# Patient Record
Sex: Male | Born: 1935 | Race: White | Hispanic: No | Marital: Married | Smoking: Never smoker
Health system: Southern US, Community
[De-identification: ages and names within clinical notes are randomized; demographics above are authoritative.]

## PROBLEM LIST (undated history)

## (undated) DIAGNOSIS — K922 Gastrointestinal hemorrhage, unspecified: Secondary | ICD-10-CM

## (undated) DIAGNOSIS — N189 Chronic kidney disease, unspecified: Secondary | ICD-10-CM

## (undated) DIAGNOSIS — E538 Deficiency of other specified B group vitamins: Secondary | ICD-10-CM

## (undated) DIAGNOSIS — Z9289 Personal history of other medical treatment: Secondary | ICD-10-CM

## (undated) DIAGNOSIS — K5733 Diverticulitis of large intestine without perforation or abscess with bleeding: Secondary | ICD-10-CM

## (undated) DIAGNOSIS — I639 Cerebral infarction, unspecified: Secondary | ICD-10-CM

## (undated) DIAGNOSIS — I4891 Unspecified atrial fibrillation: Secondary | ICD-10-CM

## (undated) HISTORY — DX: Diverticulitis of large intestine without perforation or abscess with bleeding: K57.33

## (undated) HISTORY — PX: CATARACT EXTRACTION, BILATERAL: SHX1313

## (undated) HISTORY — DX: Gastrointestinal hemorrhage, unspecified: K92.2

## (undated) HISTORY — DX: Cerebral infarction, unspecified: I63.9

## (undated) HISTORY — PX: TRACHEOSTOMY: SUR1362

## (undated) HISTORY — DX: Personal history of other medical treatment: Z92.89

---

## 1981-08-24 DIAGNOSIS — I639 Cerebral infarction, unspecified: Secondary | ICD-10-CM

## 1981-08-24 DIAGNOSIS — G459 Transient cerebral ischemic attack, unspecified: Secondary | ICD-10-CM

## 1981-08-24 HISTORY — DX: Cerebral infarction, unspecified: I63.9

## 1981-08-24 HISTORY — DX: Transient cerebral ischemic attack, unspecified: G45.9

## 1988-08-24 HISTORY — PX: SEPTOPLASTY: SUR1290

## 2000-09-24 ENCOUNTER — Encounter: Payer: Self-pay | Admitting: Family Medicine

## 2000-09-24 LAB — CONVERTED CEMR LAB: PSA: 0.6 ng/mL

## 2004-07-04 ENCOUNTER — Ambulatory Visit: Payer: Self-pay | Admitting: Family Medicine

## 2005-06-09 ENCOUNTER — Ambulatory Visit: Payer: Self-pay | Admitting: Family Medicine

## 2005-12-08 ENCOUNTER — Ambulatory Visit: Payer: Self-pay | Admitting: Family Medicine

## 2006-01-04 ENCOUNTER — Ambulatory Visit: Payer: Self-pay | Admitting: Family Medicine

## 2006-01-04 LAB — CONVERTED CEMR LAB: PSA: 0.52 ng/mL

## 2006-01-07 ENCOUNTER — Ambulatory Visit: Payer: Self-pay | Admitting: Family Medicine

## 2006-02-18 ENCOUNTER — Ambulatory Visit: Payer: Self-pay | Admitting: Family Medicine

## 2006-04-05 ENCOUNTER — Ambulatory Visit: Payer: Self-pay | Admitting: Family Medicine

## 2006-04-07 ENCOUNTER — Ambulatory Visit: Payer: Self-pay | Admitting: Family Medicine

## 2006-05-10 ENCOUNTER — Ambulatory Visit: Payer: Self-pay | Admitting: Family Medicine

## 2006-06-11 ENCOUNTER — Ambulatory Visit: Payer: Self-pay | Admitting: Family Medicine

## 2006-09-28 ENCOUNTER — Ambulatory Visit: Payer: Self-pay | Admitting: Family Medicine

## 2006-12-06 ENCOUNTER — Ambulatory Visit: Payer: Self-pay | Admitting: Family Medicine

## 2007-01-13 ENCOUNTER — Encounter: Payer: Self-pay | Admitting: Family Medicine

## 2007-01-14 ENCOUNTER — Ambulatory Visit: Payer: Self-pay | Admitting: Family Medicine

## 2007-01-14 LAB — CONVERTED CEMR LAB
Albumin: 3.6 g/dL (ref 3.5–5.2)
Alkaline Phosphatase: 55 units/L (ref 39–117)
Basophils Absolute: 0 10*3/uL (ref 0.0–0.1)
Basophils Relative: 0.2 % (ref 0.0–1.0)
Bilirubin, Direct: 0.2 mg/dL (ref 0.0–0.3)
Cholesterol: 144 mg/dL (ref 0–200)
Eosinophils Absolute: 0.3 10*3/uL (ref 0.0–0.6)
GFR calc non Af Amer: 52 mL/min
Glucose, Bld: 106 mg/dL — ABNORMAL HIGH (ref 70–99)
HCT: 40.3 % (ref 39.0–52.0)
HDL: 41.7 mg/dL (ref >39.0)
Lymphocytes Relative: 16.6 % (ref 12.0–46.0)
MCV: 88.9 fL (ref 78.0–100.0)
Monocytes Absolute: 0.2 10*3/uL (ref 0.2–0.7)
Neutro Abs: 5.9 10*3/uL (ref 1.4–7.7)
Neutrophils Relative %: 76.1 % (ref 43.0–77.0)
PSA: 0.47 ng/mL (ref 0.10–4.00)
RDW: 12.7 % (ref 11.5–14.6)
Sodium: 141 meq/L (ref 135–145)
Total Bilirubin: 1.5 mg/dL — ABNORMAL HIGH (ref 0.3–1.2)
Total Protein: 6.6 g/dL (ref 6.0–8.3)
Triglycerides: 70 mg/dL (ref 0–149)
Vitamin B-12: 285 pg/mL (ref 211–911)

## 2007-01-17 DIAGNOSIS — K219 Gastro-esophageal reflux disease without esophagitis: Secondary | ICD-10-CM

## 2007-01-17 DIAGNOSIS — E538 Deficiency of other specified B group vitamins: Secondary | ICD-10-CM | POA: Insufficient documentation

## 2007-01-19 ENCOUNTER — Ambulatory Visit: Payer: Self-pay | Admitting: Family Medicine

## 2007-02-10 ENCOUNTER — Ambulatory Visit: Payer: Self-pay | Admitting: Family Medicine

## 2007-02-11 ENCOUNTER — Encounter (INDEPENDENT_AMBULATORY_CARE_PROVIDER_SITE_OTHER): Payer: Self-pay | Admitting: *Deleted

## 2007-02-21 ENCOUNTER — Ambulatory Visit: Payer: Self-pay | Admitting: Family Medicine

## 2007-03-21 ENCOUNTER — Ambulatory Visit: Payer: Self-pay | Admitting: Family Medicine

## 2007-07-01 ENCOUNTER — Ambulatory Visit: Payer: Self-pay | Admitting: Family Medicine

## 2007-08-02 ENCOUNTER — Ambulatory Visit: Payer: Self-pay | Admitting: Family Medicine

## 2007-08-04 ENCOUNTER — Telehealth (INDEPENDENT_AMBULATORY_CARE_PROVIDER_SITE_OTHER): Payer: Self-pay | Admitting: *Deleted

## 2007-10-06 ENCOUNTER — Ambulatory Visit: Payer: Self-pay | Admitting: Family Medicine

## 2007-11-24 ENCOUNTER — Ambulatory Visit: Payer: Self-pay | Admitting: Family Medicine

## 2008-01-24 ENCOUNTER — Ambulatory Visit: Payer: Self-pay | Admitting: Family Medicine

## 2008-08-07 ENCOUNTER — Ambulatory Visit: Payer: Self-pay | Admitting: Family Medicine

## 2008-08-09 ENCOUNTER — Encounter: Payer: Self-pay | Admitting: Family Medicine

## 2008-10-17 ENCOUNTER — Ambulatory Visit: Payer: Self-pay | Admitting: Family Medicine

## 2008-10-17 LAB — CONVERTED CEMR LAB
ALT: 12 units/L (ref 0–53)
BUN: 16 mg/dL (ref 6–23)
CO2: 29 meq/L (ref 19–32)
Calcium: 9.2 mg/dL (ref 8.4–10.5)
Chloride: 103 meq/L (ref 96–112)
Eosinophils Absolute: 0.3 10*3/uL (ref 0.0–0.7)
GFR calc Af Amer: 62 mL/min
Glucose, Bld: 96 mg/dL (ref 70–99)
LDL Cholesterol: 75 mg/dL (ref 0–99)
MCHC: 34.2 g/dL (ref 30.0–36.0)
Monocytes Absolute: 0.7 10*3/uL (ref 0.1–1.0)
Monocytes Relative: 6.3 % (ref 3.0–12.0)
PSA: 0.41 ng/mL (ref 0.10–4.00)
Potassium: 4.5 meq/L (ref 3.5–5.1)
Total CHOL/HDL Ratio: 3.1
VLDL: 16 mg/dL (ref 0–40)
Vitamin B-12: >1500 pg/mL — ABNORMAL HIGH (ref 211–911)

## 2008-10-31 ENCOUNTER — Ambulatory Visit: Payer: Self-pay | Admitting: Family Medicine

## 2009-04-22 ENCOUNTER — Ambulatory Visit: Payer: Self-pay | Admitting: Family Medicine

## 2009-05-22 ENCOUNTER — Ambulatory Visit: Payer: Self-pay | Admitting: Family Medicine

## 2009-05-28 ENCOUNTER — Ambulatory Visit: Payer: Self-pay | Admitting: Family Medicine

## 2009-06-26 ENCOUNTER — Ambulatory Visit: Payer: Self-pay | Admitting: Family Medicine

## 2009-08-12 ENCOUNTER — Ambulatory Visit: Payer: Self-pay | Admitting: Family Medicine

## 2009-10-22 DIAGNOSIS — K5733 Diverticulitis of large intestine without perforation or abscess with bleeding: Secondary | ICD-10-CM

## 2009-10-22 HISTORY — DX: Diverticulitis of large intestine without perforation or abscess with bleeding: K57.33

## 2009-11-15 ENCOUNTER — Telehealth: Payer: Self-pay | Admitting: Family Medicine

## 2009-11-15 ENCOUNTER — Inpatient Hospital Stay (HOSPITAL_COMMUNITY): Admission: EM | Admit: 2009-11-15 | Discharge: 2009-11-19 | Payer: Self-pay | Admitting: Emergency Medicine

## 2009-11-15 ENCOUNTER — Ambulatory Visit: Payer: Self-pay | Admitting: Internal Medicine

## 2009-11-16 ENCOUNTER — Encounter: Payer: Self-pay | Admitting: Internal Medicine

## 2009-11-16 ENCOUNTER — Encounter (INDEPENDENT_AMBULATORY_CARE_PROVIDER_SITE_OTHER): Payer: Self-pay | Admitting: Internal Medicine

## 2009-11-19 ENCOUNTER — Encounter: Payer: Self-pay | Admitting: Family Medicine

## 2009-11-22 ENCOUNTER — Encounter: Payer: Self-pay | Admitting: Gastroenterology

## 2009-11-22 ENCOUNTER — Inpatient Hospital Stay (HOSPITAL_COMMUNITY): Admission: EM | Admit: 2009-11-22 | Discharge: 2009-11-27 | Payer: Self-pay | Admitting: Emergency Medicine

## 2009-11-22 ENCOUNTER — Encounter: Payer: Self-pay | Admitting: Family Medicine

## 2009-11-22 ENCOUNTER — Telehealth: Payer: Self-pay | Admitting: Family Medicine

## 2009-11-22 DIAGNOSIS — K922 Gastrointestinal hemorrhage, unspecified: Secondary | ICD-10-CM

## 2009-11-22 HISTORY — DX: Gastrointestinal hemorrhage, unspecified: K92.2

## 2009-11-24 DIAGNOSIS — Z9289 Personal history of other medical treatment: Secondary | ICD-10-CM

## 2009-11-24 HISTORY — DX: Personal history of other medical treatment: Z92.89

## 2009-11-25 ENCOUNTER — Encounter: Payer: Self-pay | Admitting: Gastroenterology

## 2009-11-25 ENCOUNTER — Telehealth: Payer: Self-pay | Admitting: Family Medicine

## 2009-11-27 ENCOUNTER — Encounter: Payer: Self-pay | Admitting: Family Medicine

## 2009-11-27 ENCOUNTER — Telehealth: Payer: Self-pay | Admitting: Family Medicine

## 2009-11-27 DIAGNOSIS — K5731 Diverticulosis of large intestine without perforation or abscess with bleeding: Secondary | ICD-10-CM

## 2009-11-28 ENCOUNTER — Telehealth: Payer: Self-pay | Admitting: Family Medicine

## 2009-11-29 ENCOUNTER — Telehealth: Payer: Self-pay | Admitting: Internal Medicine

## 2009-11-29 ENCOUNTER — Telehealth: Payer: Self-pay | Admitting: Family Medicine

## 2009-11-29 ENCOUNTER — Ambulatory Visit: Payer: Self-pay | Admitting: Cardiology

## 2009-11-29 DIAGNOSIS — Z9289 Personal history of other medical treatment: Secondary | ICD-10-CM

## 2009-11-29 HISTORY — DX: Personal history of other medical treatment: Z92.89

## 2009-12-03 ENCOUNTER — Encounter: Payer: Self-pay | Admitting: Family Medicine

## 2009-12-10 ENCOUNTER — Ambulatory Visit: Payer: Self-pay | Admitting: Family Medicine

## 2009-12-11 LAB — CONVERTED CEMR LAB
Eosinophils Relative: 3.1 % (ref 0.0–5.0)
Folate: 10.2 ng/mL
HCT: 35.1 % — ABNORMAL LOW (ref 39.0–52.0)
Iron: 63 ug/dL (ref 42–165)
Lymphocytes Relative: 11.1 % — ABNORMAL LOW (ref 12.0–46.0)
MCHC: 33.5 g/dL (ref 30.0–36.0)
Monocytes Absolute: 0.6 10*3/uL (ref 0.1–1.0)
Neutro Abs: 7.3 10*3/uL (ref 1.4–7.7)
Neutrophils Relative %: 78.9 % — ABNORMAL HIGH (ref 43.0–77.0)
RDW: 15.8 % — ABNORMAL HIGH (ref 11.5–14.6)
WBC: 9.3 10*3/uL (ref 4.5–10.5)

## 2009-12-19 ENCOUNTER — Encounter: Payer: Self-pay | Admitting: Family Medicine

## 2010-01-03 ENCOUNTER — Telehealth: Payer: Self-pay | Admitting: Family Medicine

## 2010-01-13 ENCOUNTER — Encounter: Payer: Self-pay | Admitting: Family Medicine

## 2010-01-14 ENCOUNTER — Ambulatory Visit: Payer: Self-pay | Admitting: Family Medicine

## 2010-01-14 DIAGNOSIS — R630 Anorexia: Secondary | ICD-10-CM

## 2010-01-28 ENCOUNTER — Ambulatory Visit: Payer: Self-pay | Admitting: Family Medicine

## 2010-01-28 LAB — CONVERTED CEMR LAB
Basophils Relative: 0.5 % (ref 0.0–3.0)
Eosinophils Absolute: 0.3 10*3/uL (ref 0.0–0.7)
Hemoglobin: 12.9 g/dL — ABNORMAL LOW (ref 13.0–17.0)
Monocytes Absolute: 0.7 10*3/uL (ref 0.1–1.0)
Neutrophils Relative %: 75.6 % (ref 43.0–77.0)
Platelets: 192 10*3/uL (ref 150.0–400.0)

## 2010-02-26 ENCOUNTER — Ambulatory Visit: Payer: Self-pay | Admitting: Family Medicine

## 2010-03-07 ENCOUNTER — Telehealth: Payer: Self-pay | Admitting: Family Medicine

## 2010-03-10 ENCOUNTER — Encounter: Payer: Self-pay | Admitting: Family Medicine

## 2010-03-26 ENCOUNTER — Encounter (INDEPENDENT_AMBULATORY_CARE_PROVIDER_SITE_OTHER): Payer: Self-pay | Admitting: *Deleted

## 2010-04-30 ENCOUNTER — Ambulatory Visit: Payer: Self-pay | Admitting: Family Medicine

## 2010-05-13 ENCOUNTER — Ambulatory Visit: Payer: Self-pay | Admitting: Family Medicine

## 2010-05-29 ENCOUNTER — Ambulatory Visit: Payer: Self-pay | Admitting: Family Medicine

## 2010-05-29 LAB — CONVERTED CEMR LAB
ALT: 13 units/L (ref 0–53)
Albumin: 3.9 g/dL (ref 3.5–5.2)
Alkaline Phosphatase: 49 units/L (ref 39–117)
BUN: 20 mg/dL (ref 6–23)
Basophils Relative: 0.4 % (ref 0.0–3.0)
Calcium: 9.4 mg/dL (ref 8.4–10.5)
Eosinophils Relative: 1.5 % (ref 0.0–5.0)
HCT: 41 % (ref 39.0–52.0)
Lymphs Abs: 1.2 10*3/uL (ref 0.7–4.0)
Monocytes Absolute: 0.7 10*3/uL (ref 0.1–1.0)
Neutro Abs: 6.9 10*3/uL (ref 1.4–7.7)
RBC: 4.4 M/uL (ref 4.22–5.81)
Sodium: 136 meq/L (ref 135–145)
Total Bilirubin: 1.9 mg/dL — ABNORMAL HIGH (ref 0.3–1.2)
Total Protein: 6.5 g/dL (ref 6.0–8.3)
WBC: 8.9 10*3/uL (ref 4.5–10.5)

## 2010-06-17 ENCOUNTER — Ambulatory Visit: Payer: Self-pay | Admitting: Family Medicine

## 2010-09-17 ENCOUNTER — Encounter: Payer: Self-pay | Admitting: Family Medicine

## 2010-09-24 NOTE — Assessment & Plan Note (Signed)
Summary: ROA FOR FOLLOW-UP/JRR   Vital Signs:  Patient profile:   77 year old male Weight:      159 pounds Temp:     97.3 degrees F oral Pulse rate:   60 / minute Pulse rhythm:   regular BP sitting:   134 / 66  (left arm) Cuff size:   regular  Vitals Entered By: Sydell Axon LPN (May 29, 2010 10:36 AM) CC: follow-up visit   History of Present Illness: Pt here with his wife for followup.  He had been in the hospital for GI bleeding from presumed diverticuli and has done well. He is caring for his wife who has significant Alzheimer's and looks to be quite tired and haggard. He denies feeling bad and has a lady coming into the house twice a week to help, which he thinks is manageable at this point. He denies seeing blood.  Problems Prior to Update: 1)  Anorexia  (ICD-783.0) 2)  Diverticulosis, Colon, With Hemorrhage  (ICD-562.12) 3)  Gastrointestinal Hemorrhage, Recurrent Lower Gi  (ICD-578.9) 4)  Screening For Malignant Neoplasm, Prostate  (ICD-V76.44) 5)  Gerd  (ICD-530.81) 6)  B12 Deficiency  (ICD-266.2) 7)  Hypercholesterolemia, 278/ Ldl 208  (ICD-272.0) 8)  Hiatal Hernia Via Egd (DR. Doreatha Martin)  (ICD-553.3)  Medications Prior to Update: 1)  Cyanocobalamin 1000 Mcg/ml Soln (Cyanocobalamin) .... Take Injection Monthly 2)  Vytorin 10-20 Mg Tabs (Ezetimibe-Simvastatin) .... Take One By Mouth Daily 3)  Tums 500 Mg Chew (Calcium Carbonate Antacid) .... As Needed 4)  Pantoprazole Sodium 40 Mg Tbec (Pantoprazole Sodium) .... Take One By Mouth Daily 5)  Acetaminophen 325 Mg Tabs (Acetaminophen) .... Take 2 By Mouth Every 4 Hours As Needed Pain 6)  Metoprolol Tartrate 25 Mg Tabs (Metoprolol Tartrate) .... Take One By Mouth Two Times A Day 7)  Mirtazapine 15 Mg Tabs (Mirtazapine) .Marland Kitchen.. 1 Tab By Mouth Nightly  Allergies: 1)  ! Sulfadiazine (Sulfadiazine)  Physical Exam  General:  Well-developed,well-nourished,in no acute distress; alert,appropriate and cooperative throughout  examination, thin but weight stable. Head:  Normocephalic and atraumatic without obvious abnormalities. No apparent alopecia or balding. Eyes:  Conjunctiva clear bilaterally.  Ears:  External ear exam shows no significant lesions or deformities.  Otoscopic examination reveals clear canals, tympanic membranes are intact bilaterally without bulging, retraction, inflammation or discharge. Hearing is grossly normal bilaterally. Nose:  External nasal examination shows no deformity or inflammation. Nasal mucosa are pink and moist without lesions or exudates. Mouth:  Oral mucosa and oropharynx without lesions or exudates.  Teeth in good repair. Neck:  No deformities, masses, or tenderness noted. Chest Wall:  No deformities, masses, tenderness or gynecomastia noted. Lungs:  Normal respiratory effort, chest expands symmetrically. Lungs are clear to auscultation, no crackles or wheezes. Heart:  Normal rate and regular rhythm. S1 and S2 normal without gallop, murmur, click, rub or other extra sounds. Abdomen:  Bowel sounds positive, very active but benign sounding, abdomen soft and non-tender without masses, organomegaly or hernias noted. Psych:  Cognition and judgment appear intact. Alert and cooperative with normal attention span and concentration. No apparent delusions, illusions, hallucinations   Impression & Recommendations:  Problem # 1:  ANOREXIA (ICD-783.0) Assessment Improved Eating better with  stabilized weight.  Problem # 2:  DIVERTICULOSIS, COLON, WITH HEMORRHAGE (ICD-562.12) Assessment: Unchanged  Appears stable.  Labs Reviewed: Hgb: 12.9 (01/28/2010)   Hct: 38.0 (01/28/2010)   WBC: 8.9 (01/28/2010)  Problem # 3:  GASTROINTESTINAL HEMORRHAGE, RECURRENT LOWER GI (ICD-578.9) Assessment: Unchanged Appears stable.  Will recheck blood count. Orders: TLB-CBC Platelet - w/Differential (85025-CBCD) TLB-BMP (Basic Metabolic Panel-BMET) (80048-METABOL) TLB-Hepatic/Liver Function Pnl  (80076-HEPATIC)  Complete Medication List: 1)  Cyanocobalamin 1000 Mcg/ml Soln (Cyanocobalamin) .... Take injection monthly 2)  Vytorin 10-20 Mg Tabs (Ezetimibe-simvastatin) .... Take one by mouth daily 3)  Tums 500 Mg Chew (Calcium carbonate antacid) .... As needed 4)  Pantoprazole Sodium 40 Mg Tbec (Pantoprazole sodium) .... Take one by mouth daily 5)  Acetaminophen 325 Mg Tabs (Acetaminophen) .... Take 2 by mouth every 4 hours as needed pain 6)  Metoprolol Tartrate 25 Mg Tabs (Metoprolol tartrate) .... Take one by mouth two times a day  Patient Instructions: 1)  RTC as needed or 3 mos, whichever sooner.  Current Allergies (reviewed today): ! SULFADIAZINE (SULFADIAZINE)

## 2010-09-24 NOTE — Letter (Signed)
Summary: Dr.Carl [facility] Consultation  [facility] Consultation   Imported By: Beau Fanny 11/25/2009 14:51:16  _____________________________________________________________________  External Attachment:    Type:   Image     Comment:   External Document

## 2010-09-24 NOTE — Assessment & Plan Note (Signed)
Summary: B-12  Nurse Visit   Allergies: 1)  ! Sulfadiazine (Sulfadiazine)  Medication Administration  Injection # 1:    Medication: Vit B12 1000 mcg    Diagnosis: B12 DEFICIENCY (ICD-266.2)    Route: IM    Site: L deltoid    Exp Date: 07/24/2011    Lot #: [id number]    Mfr: [employer] Regent    Patient tolerated injection without complications    Given by: Delilah Shan CMA Duncan Dull) (Jan 14, 2010 9:48 AM)  Orders Added: 1)  Admin of Therapeutic Inj  intramuscular or subcutaneous [96372] 2)  Vit B12 1000 mcg [J3420]   Medication Administration  Injection # 1:    Medication: Vit B12 1000 mcg    Diagnosis: B12 DEFICIENCY (ICD-266.2)    Route: IM    Site: L deltoid    Exp Date: 07/24/2011    Lot #: [id number]    Mfr: American Regent    Patient tolerated injection without complications    Given by: Delilah Shan CMA (AAMA) (Jan 14, 2010 9:48 AM)  Orders Added: 1)  Admin of Therapeutic Inj  intramuscular or subcutaneous [96372] 2)  Vit B12 1000 mcg [J3420]

## 2010-09-24 NOTE — Progress Notes (Signed)
Summary: regarding meds  Phone Note Call from Patient   Caller: Magnus Ivan [phone] Call For: Shaune Leeks MD Summary of Call: Pt's son called again today regarding pt's new meds.  Do you think pt should start metoprolol and pantoprazole?  He knows you arent in today. He also wanted you  to know that Dr. Irene Limbo" DID NOT" want to keep the pt in the hospital.   Initial call taken by: Lowella Petties CMA,  November 29, 2009 8:58 AM  Follow-up for Phone Call        Starting Metoprolol reasonable to protect heart against another heart attack.  Pantoprazole is merely prophylactic measure. Probably good idea for one month or so. Follow-up by: Shaune Leeks MD,  December 02, 2009 7:55 AM  Additional Follow-up for Phone Call Additional follow up Details #1::        Advised pt's son. Additional Follow-up by: Lowella Petties CMA,  December 02, 2009 11:00 AM

## 2010-09-24 NOTE — Letter (Signed)
Summary: [facility],Medical Necessity for Wheelchair  [facility] Equipment,Medical Necessity for Wheelchair   Imported By: Beau Fanny 12/04/2009 10:20:49  _____________________________________________________________________  External Attachment:    Type:   Image     Comment:   External Document

## 2010-09-24 NOTE — Progress Notes (Signed)
Summary: regarding meds and CT  Phone Note Call from Patient   Caller: SonAnnette Stable  [phone] Summary of Call: Pt was discharged from hospital yesterday and was given scripts for simvastatin 20 mg- 1 a day, pantoprazole 40 mg- one a day, and metoprolol 25 mg- one two times a day.  Son wants your opinion on whether pt should take these, he is already taking vytorin.  Also, he is asking if you think the pt should have an abd CT scan in regards to his GI bleed. Initial call taken by: Lowella Petties CMA,  November 28, 2009 9:28 AM  Follow-up for Phone Call        If can afford Vytorin, would continue. IF breaking the bank, Simvastatin ok. Will request CT abd w/ to assess abd. Pls tell them Dr Irene Limbo, the last doctor to take care of Mr Bonnette called me this AM. He wanted to kepp Mr Harner in the hospitqal to get the CT today, but he was insistent on getting home yesterday. CT ordered. Follow-up by: Shaune Leeks MD,  November 28, 2009 1:56 PM  Additional Follow-up for Phone Call Additional follow up Details #1::        CT ABD / PeL set up at [facility] on 11/29/2009 at 12:15pm. Patients son notified of appt. Carlton Adam  November 28, 2009 4:30 PM  Additional Follow-up by: Carlton Adam,  November 28, 2009 4:30 PM  New Problems: GASTROINTESTINAL HEMORRHAGE, RECURRENT LOWER GI (ICD-578.9)   New Problems: GASTROINTESTINAL HEMORRHAGE, RECURRENT LOWER GI (ICD-578.9)

## 2010-09-24 NOTE — Letter (Signed)
Summary: Ronnie Chan letter  [place] [facility]  [address] [place], [place] [address]   Phone: [phone]  Fax: [phone]       03/26/2010 MRN: [id number]  Ronnie Chan [address] [place], [place]  [address]  Dear [name] [place], and [place] announce the retirement of [name], M.D., from full-time practice at the [facility] office effective February 20, 2010 and his plans of returning part-time.  It is important to [name] and to our practice that you understand that [facility] has seven physicians in our office for your health care needs.  We will continue to offer the same exceptional care that you have today.    [name] has spoken to many of you about his plans for retirement and returning part-time in the fall.   We will continue to work with you through the transition to schedule appointments for you in the office and meet the high standards that [name] is committed to.   Again, it is with great pleasure that we share the news that [name] will return to [facility] in October of 2011 with a reduced schedule.    If you have any questions, or would like to request an appointment with one of our physicians, please call us at [phone] and press the option for Scheduling an appointment.  We take pleasure in providing you with excellent patient care and look forward to seeing you at your next office visit.  Our [facility] Physicians are:  Tillman Abide, M.D. Laurita Quint, M.D. Roxy Manns, M.D. Kerby Nora, M.D. Hannah Beat, M.D. Ruthe Mannan, M.D. We proudly welcomed Raechel Ache, M.D. and Eustaquio Boyden, M.D. to the practice in July/August 2011.  Sincerely,  [facility]

## 2010-09-24 NOTE — Letter (Signed)
Summary: [facility] Equipment Documentation for Medical Necessity f  [facility] Equipment Documentation for Medical Necessity for Wheelchair   Imported By: Beau Fanny 11/29/2009 11:45:18  _____________________________________________________________________  External Attachment:    Type:   Image     Comment:   External Document

## 2010-09-24 NOTE — Progress Notes (Signed)
Summary: wants order for wheel chair  Phone Note Call from Patient   Caller: Laveda Norman [phone] Summary of Call: Son is asking for an order for a wheel chair for pt.   He is back at [facility], has been passing blood rectally, has been there since 3/31 Initial call taken by: Lowella Petties CMA,  November 25, 2009 1:54 PM  Follow-up for Phone Call        done...weakness 780.79 and acute blood loss anemia 285.1. Follow-up by: Shaune Leeks MD,  November 25, 2009 2:00 PM  Additional Follow-up for Phone Call Additional follow up Details #1::        Son to pick up order. Additional Follow-up by: Lowella Petties CMA,  November 26, 2009 12:44 PM

## 2010-09-24 NOTE — Consult Note (Signed)
Summary: Dr.Jens Eichhorn,[facility],Note  Dr.Jens Eichhorn,[facility],Note   Imported By: Beau Fanny 01/06/2010 14:46:16  _____________________________________________________________________  External Attachment:    Type:   Image     Comment:   External Document

## 2010-09-24 NOTE — Letter (Signed)
Summary: Note from Misty Stanley Mikowski,pt's daughter-in-law  Note from [place] Hosick,pt's daughter-in-law   Imported By: Beau Fanny 01/14/2010 15:04:23  _____________________________________________________________________  External Attachment:    Type:   Image     Comment:   External Document

## 2010-09-24 NOTE — Assessment & Plan Note (Signed)
Summary: SCHALLER B12/RBH  Nurse Visit   Allergies: 1)  ! Sulfadiazine (Sulfadiazine)  Medication Administration  Injection # 1:    Medication: Vit B12 1000 mcg    Diagnosis: B12 DEFICIENCY (ICD-266.2)    Route: IM    Site: R deltoid    Exp Date: 01/23/2012    Lot #: [id number]    Mfr: [employer] Regent    Patient tolerated injection without complications    Given by: Sydell Axon LPN (June 17, 2010 11:26 AM)  Orders Added: 1)  Vit B12 1000 mcg [J3420] 2)  Admin of Therapeutic Inj  intramuscular or subcutaneous [96372]   Medication Administration  Injection # 1:    Medication: Vit B12 1000 mcg    Diagnosis: B12 DEFICIENCY (ICD-266.2)    Route: IM    Site: R deltoid    Exp Date: 01/23/2012    Lot #: [id number]    Mfr: American Regent    Patient tolerated injection without complications    Given by: Sydell Axon LPN (June 17, 2010 11:26 AM)  Orders Added: 1)  Vit B12 1000 mcg [J3420] 2)  Admin of Therapeutic Inj  intramuscular or subcutaneous [04540]

## 2010-09-24 NOTE — Procedures (Signed)
Summary: Capsule Endoscopy   Capsule Endoscopy  Procedure date:  11/25/2009  Findings:      Performing Location: [facility]   Ordering Physician: Karolee Ohs, MD  Report created/read by: Claudette Head, MD  Reason for Referral:  6 t/o male admitted to [facility] with GI bleeding.  EGD and colonoscopy pertinent for diverticulosis.  R/O small bowel source for bleed.    Procedure Information and Findings:  1) Complete study, good prep 2) duodenal AVM, small 3 ) smooth large whitish protruding lesion at 1 hr and 48 minutes ? Lymphoid but larger than what is usually seen, no evidence of ulceration.    Summary and Recommendations:  No findings to explain acute bleed-suspect diverticular in origin.    This report was created from the original report, which was reviewed and signed by the above listed reading physician.

## 2010-09-24 NOTE — Progress Notes (Signed)
Summary: prior auth needed for pantoprazole  Phone Note From Pharmacy   Caller: [facility] [address] Summary of Call: Prior Berkley Harvey is needed for pantoprazole, form is on your desk. Initial call taken by: Lowella Petties CMA,  March 07, 2010 2:30 PM  Follow-up for Phone Call        signed.  Follow-up by: Crawford Givens MD,  March 09, 2010 4:55 PM  Additional Follow-up for Phone Call Additional follow up Details #1::        Faxed. Additional Follow-up by: Delilah Shan CMA Aster Eckrich Dull),  March 10, 2010 8:40 AM     Appended Document: prior auth needed for pantoprazole Prior auth received for pantoprazole, pharmacy advised, letter and form placed up front to be scanned.

## 2010-09-24 NOTE — Assessment & Plan Note (Signed)
Summary: [place]can flu shot/rbh  Nurse Visit   Allergies: 1)  ! Sulfadiazine (Sulfadiazine)  Immunizations Administered:  Influenza Vaccine # 1:    Vaccine Type: Fluvax 3+    Site: left deltoid    Mfr: GlaxoSmithKline    Dose: 0.5 ml    Route: IM    Given by: Mervin Hack CMA (AAMA)    Exp. Date: 02/21/2011    Lot #: [id number]    VIS given: 03/18/10 version given May 13, 2010.  Flu Vaccine Consent Questions:    Do you have a history of severe allergic reactions to this vaccine? no    Any prior history of allergic reactions to egg and/or gelatin? no    Do you have a sensitivity to the preservative Thimersol? no    Do you have a past history of Guillan-Barre Syndrome? no    Do you currently have an acute febrile illness? no    Have you ever had a severe reaction to latex? no    Vaccine information given and explained to patient? yes  Orders Added: 1)  Flu Vaccine 58yrs + [90658] 2)  Admin 1st Vaccine [08657]

## 2010-09-24 NOTE — Assessment & Plan Note (Signed)
Summary: B-12  Nurse Visit   Allergies: 1)  ! Sulfadiazine (Sulfadiazine)  Medication Administration  Injection # 1:    Medication: Vit B12 1000 mcg    Diagnosis: B12 DEFICIENCY (ICD-266.2)    Route: IM    Site: R deltoid    Exp Date: 07/25/2011    Lot #: [id number]    Mfr: American Regent    Patient tolerated injection without complications    Given by: Sydell Axon LPN (February 26, 6961 2:06 PM)  Orders Added: 1)  Vit B12 1000 mcg [J3420] 2)  Admin of Therapeutic Inj  intramuscular or subcutaneous [96372]   Medication Administration  Injection # 1:    Medication: Vit B12 1000 mcg    Diagnosis: B12 DEFICIENCY (ICD-266.2)    Route: IM    Site: R deltoid    Exp Date: 07/25/2011    Lot #: [id number]    Mfr: American Regent    Patient tolerated injection without complications    Given by: Sydell Axon LPN (February 27, 4131 2:06 PM)  Orders Added: 1)  Vit B12 1000 mcg [J3420] 2)  Admin of Therapeutic Inj  intramuscular or subcutaneous [44010]

## 2010-09-24 NOTE — Procedures (Signed)
Summary: Upper Endoscopy  Patient: Ronnie Chan Note: All result statuses are Final unless otherwise noted.  Tests: (1) Upper Endoscopy (EGD)   EGD Upper Endoscopy       DONE     [facility]     [address]     [place], [place]  [address]           ENDOSCOPY PROCEDURE REPORT           PATIENT:  [name], [name]  MR#:  [id number]     BIRTHDATE:  03-24-1925, 85 yrs. old  GENDER:  male           ENDOSCOPIST:  Barbette Hair. Arlyce Dice, MD     Referred by:           PROCEDURE DATE:  11/22/2009     PROCEDURE:  EGD, diagnostic     ASA CLASS:  Class III     INDICATIONS:  hematochezia; recent polypectomy .  d/ced 1 day ago     for acute LGI bleed thought secondary to diverticula.  Exam done     to r/o occult UGI bleeding source           MEDICATIONS:   Fentanyl 25 mcg IV, Versed 3 mg IV, glycopyrrolate     (Robinal) 0.2 mg     TOPICAL ANESTHETIC:           DESCRIPTION OF PROCEDURE:   After the risks benefits and     alternatives of the procedure were thoroughly explained, informed     consent was obtained.  The  endoscope was introduced through the     mouth and advanced to the third portion of the duodenum, without     limitations.  The instrument was slowly withdrawn as the mucosa     was fully examined.     <<PROCEDUREIMAGES>>           The upper, middle, and distal third of the esophagus were     carefully inspected and no abnormalities were noted. The z-line     was well seen at the GEJ. The endoscope was pushed into the fundus     which was normal including a retroflexed view. The antrum,gastric     body, first and second part of the duodenum were unremarkable (see     image001, image002, image003, image004, image005, image006,     image007, and image008).    Retroflexed views revealed no     abnormalities.    The scope was then withdrawn from the patient     and the procedure completed.           COMPLICATIONS:  None           ENDOSCOPIC IMPRESSION:  1) Normal EGD           Acute GI bleeding is secondary to LGI source - likely diverticula           RECOMMENDATIONS:If bleeding rate increases --- proceed with     bleeding scan.           REPEAT EXAM:  No           ______________________________     Barbette Hair. Arlyce Dice, MD           CC:  [name], MD, Stan Head, MD           n.     eSIGNED:   Barbette Hair. Anel Creighton at 11/22/2009 05:11 PM  [place], [place], [id number]  Note: An exclamation mark (!) indicates a result that was not dispersed into the flowsheet. Document Creation Date: 11/22/2009 5:12 PM _______________________________________________________________________  (1) Order result status: Final Collection or observation date-time: 11/22/2009 17:06 Requested date-time:  Receipt date-time:  Reported date-time:  Referring Physician:   Ordering Physician: Melvia Heaps [phone]) Specimen Source:  Source: Launa Grill Order Number: [id number] Lab site:

## 2010-09-24 NOTE — Progress Notes (Signed)
Summary: pt walk in this am c/o rectal bleeding  Phone Note Other Incoming   Summary of Call: Pt walked in this am at about 7:38 c/o rectal bleeding. He said it started about this 5am he went to the restroom and the commode was full of blood. he spoke with his son a paramedic and his son advised he come into the office today. Pt stated that again before he came in he had another instance of using the restroom and filling the commode with blood. Dr Dayton Martes advised that due to the severity/ quantity of blood he needed to be evaluated in ER. Pt refused at first but then decided to go to [facility]. He was with his wife and did not want to drive himself but also did not want to go by EMS. He called a relative to pick him up and take him to ER. I offered to make the call for him but he said he could make the call himself. Initial call taken by: Liane Comber CMA Duncan Dull),  November 15, 2009 12:06 PM

## 2010-09-24 NOTE — Medication Information (Signed)
Summary: Prior Authorization & Approval for Additional Quantity Pantopraz  Prior Authorization & Approval for Additional Quantity Pantoprazole/[employer]   Imported By: Lanelle Bal 03/18/2010 13:19:58  _____________________________________________________________________  External Attachment:    Type:   Image     Comment:   External Document

## 2010-09-24 NOTE — Assessment & Plan Note (Signed)
Summary: HOSPITAL FOLLOW UP - CONE/ lb   Vital Signs:  Patient profile:   77 year old male Height:      68 inches Weight:      163 pounds BMI:     24.87 Temp:     98.3 degrees F oral Pulse rate:   60 / minute Pulse rhythm:   regular BP sitting:   138 / 68  (left arm) Cuff size:   regular  Vitals Entered By: Sydell Axon LPN (December 10, 2009 12:18 PM) CC: Hospital follow-up from [facility], needs B-12 shot   History of Present Illness: Pt here for followup from the hospital for lower GI Bleed felt to be from diverticular source making him anemic and requiring four units. He was discharged with Hgb of 11 and has not been checked since heleft the hospital 4/6. He feels pretty good for everything that happened...is slightly tired but he thinks that is more from his wife's activities and the difficulty of resting well than from anything physical with him. He is eating well and sleeping as she allows him. He is using the BR normally. He had had 2 endoscopies, a colonoscopy, a tagged bleeding  study and a capsule camera study.  Problems Prior to Update: 1)  Gastrointestinal Hemorrhage, Recurrent Lower Gi  (ICD-578.9) 2)  Screening For Malignant Neoplasm, Prostate  (ICD-V76.44) 3)  Gerd  (ICD-530.81) 4)  B12 Deficiency  (ICD-266.2) 5)  Hypercholesterolemia, 278/ Ldl 208  (ICD-272.0) 6)  Hiatal Hernia Via Egd (DR. Doreatha Martin)  (ICD-553.3)  Medications Prior to Update: 1)  Bufferin Low Dose 81 Mg Tbec (Aspirin) .... Take One By Mouth Daily 2)  Cyanocobalamin 1000 Mcg/ml Soln (Cyanocobalamin) .... Take Injection Monthly 3)  Vytorin 10-20 Mg Tabs (Ezetimibe-Simvastatin) .... Take One By Mouth Daily 4)  Antivert 25 Mg Tabs (Meclizine Hcl) .... Take One By Mouth Q 8 Hours Prn 5)  Tums 500 Mg Chew (Calcium Carbonate Antacid) .... Prn 6)  Viagra 50 Mg Tabs (Sildenafil Citrate) .... Take 1 By Mouth As Directed Prn  Allergies: 1)  ! Sulfadiazine (Sulfadiazine)  Physical Exam  General:   Well-developed,well-nourished,in no acute distress; alert,appropriate and cooperative throughout examination, thinner. Head:  Normocephalic and atraumatic without obvious abnormalities. No apparent alopecia or balding. Eyes:  Conjunctiva clear bilaterally.  Ears:  External ear exam shows no significant lesions or deformities.  Otoscopic examination reveals clear canals, tympanic membranes are intact bilaterally without bulging, retraction, inflammation or discharge. Hearing is grossly normal bilaterally. Nose:  External nasal examination shows no deformity or inflammation. Nasal mucosa are pink and moist without lesions or exudates. Mouth:  Oral mucosa and oropharynx without lesions or exudates.  Teeth in good repair. Neck:  No deformities, masses, or tenderness noted. Chest Wall:  No deformities, masses, tenderness or gynecomastia noted. Lungs:  Normal respiratory effort, chest expands symmetrically. Lungs are clear to auscultation, no crackles or wheezes. Heart:  Normal rate and regular rhythm. S1 and S2 normal without gallop, murmur, click, rub or other extra sounds. Abdomen:  Bowel sounds positive,abdomen soft and non-tender without masses, organomegaly or hernias noted.   Impression & Recommendations:  Problem # 1:  GASTROINTESTINAL HEMORRHAGE, RECURRENT LOWER GI (ICD-578.9) Assessment Improved Strength improved and color looks good. Willl recheck CBC today with B12, Folate. Orders: Venipuncture (64403) TLB-CBC Platelet - w/Differential (85025-CBCD) TLB-B12 + Folate Pnl ([id number]/EPP)  Problem # 2:  DIVERTICULOSIS, COLON, WITH HEMORRHAGE (ICD-562.12) Assessment: New  Seems stable now. Discussed CT results with small aneurysm of abd aorta, cyst  of kidney and stones in kidney pelvis.  Labs Reviewed: Hgb: 14.0 (10/17/2008)   Hct: 41.0 (10/17/2008)   WBC: 10.4 (10/17/2008)  Complete Medication List: 1)  Cyanocobalamin 1000 Mcg/ml Soln (Cyanocobalamin) .... Take injection  monthly 2)  Vytorin 10-20 Mg Tabs (Ezetimibe-simvastatin) .... Take one by mouth daily 3)  Tums 500 Mg Chew (Calcium carbonate antacid) .... Prn 4)  Pantoprazole Sodium 40 Mg Tbec (Pantoprazole sodium) .... Take one by mouth daily 5)  Acetaminophen 325 Mg Tabs (Acetaminophen) .... Take 2 by mouth every 4 hours as needed pain 6)  Metoprolol Tartrate 25 Mg Tabs (Metoprolol tartrate) .... Take one by mouth two times a day  Other Orders: Vit B12 1000 mcg (J3420) Admin of Therapeutic Inj  intramuscular or subcutaneous (82956)  Patient Instructions: 1)  RTC as needed. 2)  Will call with lab results.  Current Allergies (reviewed today): ! SULFADIAZINE (SULFADIAZINE)   Medication Administration  Injection # 1:    Medication: Vit B12 1000 mcg    Diagnosis: B12 DEFICIENCY (ICD-266.2)    Route: IM    Site: L deltoid    Exp Date: 06/25/2011    Lot #: [id number]    Mfr: American Regent    Patient tolerated injection without complications    Given by: Sydell Axon LPN (December 10, 2009 12:35 PM)  Orders Added: 1)  Vit B12 1000 mcg [J3420] 2)  Admin of Therapeutic Inj  intramuscular or subcutaneous [96372] 3)  Venipuncture [36415] 4)  TLB-CBC Platelet - w/Differential [85025-CBCD] 5)  TLB-B12 + Folate Pnl [[id number]/FOL] 6)  Est. Patient Level IV [21308]  Appended Document: HOSPITAL FOLLOW UP - CONE/ lb

## 2010-09-24 NOTE — Letter (Signed)
Summary: Application for Handicapped Parking Placard Form  Application for Handicapped Parking Placard Form   Imported By: Beau Fanny 11/20/2009 09:53:38  _____________________________________________________________________  External Attachment:    Type:   Image     Comment:   External Document

## 2010-09-24 NOTE — Assessment & Plan Note (Signed)
Summary: 2WK FOLLOW UP / LFW   Vital Signs:  Patient profile:   77 year old male Weight:      158.25 pounds Temp:     97.8 degrees F oral Pulse rate:   76 / minute Pulse rhythm:   regular BP sitting:   130 / 68  (left arm) Cuff size:   regular  Vitals Entered By: Sydell Axon LPN (January 28, 1609 12:25 PM) CC: 2 Week follow-up   History of Present Illness: Pt here for 2 week followup. HE was seen last time after discharge from hosp for GI bleed. He appeared stable and indeed has had no probs since being seen. He has seen no blood or dark stools. He was found to have little to no appetite last time and I started Remeron to try to stimulate appetite. Unfortunatley it made him sleepy and helped him sleep but had no effect upon appetite per his perception. He thinks he is eating well and his weight ias stable from two weeks ago. He has started taking Pantoprazole 45 mins before brfst with no GERD sxs since and is now taking his Vytorin at [employer]. He is trying to watch his food intake. Overall he feels fine.  Problems Prior to Update: 1)  Anorexia  (ICD-783.0) 2)  Diverticulosis, Colon, With Hemorrhage  (ICD-562.12) 3)  Gastrointestinal Hemorrhage, Recurrent Lower Gi  (ICD-578.9) 4)  Screening For Malignant Neoplasm, Prostate  (ICD-V76.44) 5)  Gerd  (ICD-530.81) 6)  B12 Deficiency  (ICD-266.2) 7)  Hypercholesterolemia, 278/ Ldl 208  (ICD-272.0) 8)  Hiatal Hernia Via Egd (DR. Doreatha Martin)  (ICD-553.3)  Medications Prior to Update: 1)  Cyanocobalamin 1000 Mcg/ml Soln (Cyanocobalamin) .... Take Injection Monthly 2)  Vytorin 10-20 Mg Tabs (Ezetimibe-Simvastatin) .... Take One By Mouth Daily 3)  Tums 500 Mg Chew (Calcium Carbonate Antacid) .... As Needed 4)  Pantoprazole Sodium 40 Mg Tbec (Pantoprazole Sodium) .... Take One By Mouth Daily 5)  Acetaminophen 325 Mg Tabs (Acetaminophen) .... Take 2 By Mouth Every 4 Hours As Needed Pain 6)  Metoprolol Tartrate 25 Mg Tabs (Metoprolol Tartrate) .... Take  One By Mouth Two Times A Day 7)  Mirtazapine 15 Mg Tabs (Mirtazapine) .... 1/2 Tab By Mouth At Night For 6 Nites and Then 1 Tab Nightly.  Allergies: 1)  ! Sulfadiazine (Sulfadiazine)  Physical Exam  General:  Well-developed,well-nourished,in no acute distress; alert,appropriate and cooperative throughout examination, thinner still. 5 pounds lighter than 4/11 Head:  Normocephalic and atraumatic without obvious abnormalities. No apparent alopecia or balding. Eyes:  Conjunctiva clear bilaterally.  Ears:  External ear exam shows no significant lesions or deformities.  Otoscopic examination reveals clear canals, tympanic membranes are intact bilaterally without bulging, retraction, inflammation or discharge. Hearing is grossly normal bilaterally. Nose:  External nasal examination shows no deformity or inflammation. Nasal mucosa are pink and moist without lesions or exudates. Mouth:  Oral mucosa and oropharynx without lesions or exudates.  Teeth in good repair. Neck:  No deformities, masses, or tenderness noted. Chest Wall:  No deformities, masses, tenderness or gynecomastia noted. Lungs:  Normal respiratory effort, chest expands symmetrically. Lungs are clear to auscultation, no crackles or wheezes. Heart:  Normal rate and regular rhythm. S1 and S2 normal without gallop, murmur, click, rub or other extra sounds. Abdomen:  Bowel sounds positive, very active but benign sounding, abdomen soft and non-tender without masses, organomegaly or hernias noted.   Impression & Recommendations:  Problem # 1:  ANOREXIA (ICD-783.0) Assessment Unchanged No better and no  worse. Pt does not think his appetite is bad. Will follow. Do not want to start, nor does he, on Megace. Will follow.  Problem # 2:  DIVERTICULOSIS, COLON, WITH HEMORRHAGE (ICD-562.12) Assessment: Unchanged Stable ...will check CBC.  Problem # 3:  GERD (ICD-530.81) Assessment: Improved Encouraged to cont curr measures. His updated  medication list for this problem includes:    Tums 500 Mg Chew (Calcium carbonate antacid) .Marland Kitchen... As needed    Pantoprazole Sodium 40 Mg Tbec (Pantoprazole sodium) .Marland Kitchen... Take one by mouth daily  Diagnostics Reviewed:  EGD: DONE (11/22/2009) Discussed lifestyle modifications, diet, antacids/medications, and preventive measures. Handout provided.   Problem # 4:  HYPERCHOLESTEROLEMIA, 278/ LDL 208 (ICD-272.0) Assessment: Unchanged  Cont Vytorin at nite. His updated medication list for this problem includes:    Vytorin 10-20 Mg Tabs (Ezetimibe-simvastatin) .Marland Kitchen... Take one by mouth daily  Labs Reviewed: SGOT: 18 (10/17/2008)   SGPT: 12 (10/17/2008)   HDL:42.3 (10/17/2008), 41.7 (01/14/2007)  LDL:75 (10/17/2008), 88 (01/14/2007)  Chol:133 (10/17/2008), 144 (01/14/2007)  Trig:78 (10/17/2008), 70 (01/14/2007)  Complete Medication List: 1)  Cyanocobalamin 1000 Mcg/ml Soln (Cyanocobalamin) .... Take injection monthly 2)  Vytorin 10-20 Mg Tabs (Ezetimibe-simvastatin) .... Take one by mouth daily 3)  Tums 500 Mg Chew (Calcium carbonate antacid) .... As needed 4)  Pantoprazole Sodium 40 Mg Tbec (Pantoprazole sodium) .... Take one by mouth daily 5)  Acetaminophen 325 Mg Tabs (Acetaminophen) .... Take 2 by mouth every 4 hours as needed pain 6)  Metoprolol Tartrate 25 Mg Tabs (Metoprolol tartrate) .... Take one by mouth two times a day 7)  Mirtazapine 15 Mg Tabs (Mirtazapine) .Marland Kitchen.. 1 tab by mouth nightly  Other Orders: Venipuncture (98119) TLB-CBC Platelet - w/Differential (85025-CBCD)  Patient Instructions: 1)  RTC 3 mos for recheck, sooner as needed.  Current Allergies (reviewed today): ! SULFADIAZINE (SULFADIAZINE)

## 2010-09-24 NOTE — Letter (Signed)
Summary: [facility]   Imported By: Beau Fanny 11/29/2009 11:48:01  _____________________________________________________________________  External Attachment:    Type:   Image     Comment:   External Document  Appended Document: [facility]    Clinical Lists Changes  Observations: Added new observation of PAST SURG HX: Tracheostomy 7yoa Strep Throat H/O mini Stroke (Dr Meryl Crutch )  1983 L Septoplasty (Dr Haroldine Laws) 1990 Cattaracts 09/1997   10/1997 HOSP Divertic Bleed   ARF due Dehydr   Acute Blood loss Anemia due Divertic Bleed  3/25-3/29/2011 Colonoscopy  Divertics   No Active Bleeding  Cecal Polyp  Hemms  11/16/2009 HOSP Lower GI Bleed  Acute Blood Loss Anemia  Transfusion 4Units PRBCs  NonSTEMI 4/1-11/27/2009 EGD Nml (Dr Leone Payor) 11/23/2009 Small Capsule Endoscopy  Duod Erosion  11/25/2009 Tagged RBC Scan  Nml No Bleeding Source Found  11/25/2009 ETT Myoview  Nml EF 48% 11/24/2009 (12/02/2009 7:49)       Past Surgical History:    Tracheostomy 7yoa Strep Throat    H/O mini Stroke (Dr Meryl Crutch )  1983    L Septoplasty (Dr Haroldine Laws) 1990    Cattaracts 09/1997   10/1997    HOSP Divertic Bleed   ARF due Dehydr   Acute Blood loss Anemia due Divertic Bleed  3/25-3/29/2011    Colonoscopy  Divertics   No Active Bleeding  Cecal Polyp  Hemms  11/16/2009    HOSP Lower GI Bleed  Acute Blood Loss Anemia  Transfusion 4Units PRBCs  NonSTEMI 4/1-11/27/2009    EGD Nml (Dr Leone Payor) 11/23/2009    Small Capsule Endoscopy  Duod Erosion  11/25/2009    Tagged RBC Scan  Nml No Bleeding Source Found  11/25/2009    ETT Myoview  Nml EF 48% 11/24/2009

## 2010-09-24 NOTE — Progress Notes (Signed)
Summary: pt is losing weight  Phone Note Call from Patient   Caller: Daughter in law  Misty Stanley  [phone] Summary of Call: Daughter in law states pt has no appetite and is losing weight.  She is asking if you think he would benefit from an appetite stimulant. Initial call taken by: Lowella Petties CMA,  Jan 03, 2010 3:02 PM  Follow-up for Phone Call        Have pt come is to be seen please. Follow-up by: Shaune Leeks MD,  Jan 04, 2010 5:56 AM  Additional Follow-up for Phone Call Additional follow up Details #1::        [facility] for daughter in law to call.  Lowella Petties CMA  Jan 06, 2010 11:02 AM  Appt made. Additional Follow-up by: Lowella Petties CMA,  Jan 07, 2010 11:18 AM

## 2010-09-24 NOTE — Assessment & Plan Note (Signed)
Summary: Para March b12/rbh  Nurse Visit   Allergies: 1)  ! Sulfadiazine (Sulfadiazine)  Medication Administration  Injection # 1:    Medication: Vit B12 1000 mcg    Diagnosis: B12 DEFICIENCY (ICD-266.2)    Route: IM    Site: L deltoid    Exp Date: 12/22/2011    Lot #: [id number]    Mfr: [employer] Regent    Patient tolerated injection without complications    Given by: Delilah Shan CMA (AAMA) (April 30, 2010 4:00 PM)  Orders Added: 1)  Admin of Therapeutic Inj  intramuscular or subcutaneous [96372] 2)  Vit B12 1000 mcg [J3420]   Medication Administration  Injection # 1:    Medication: Vit B12 1000 mcg    Diagnosis: B12 DEFICIENCY (ICD-266.2)    Route: IM    Site: L deltoid    Exp Date: 12/22/2011    Lot #: [id number]    Mfr: [employer] Regent    Patient tolerated injection without complications    Given by: Delilah Shan CMA (AAMA) (April 30, 2010 4:00 PM)  Orders Added: 1)  Admin of Therapeutic Inj  intramuscular or subcutaneous [96372] 2)  Vit B12 1000 mcg [J3420]

## 2010-09-24 NOTE — Progress Notes (Signed)
Summary: CT results  Phone Note Other Incoming   Caller: Kristy @ [facility] [phone] Summary of Call: [name] Ronnie Chan (CT results forwarded to you) [facility] would like to know if they can send the Ronnie Chan home, Ronnie Chan is still there waiting. Initial call taken by: Mervin Hack CMA Duncan Dull),  November 29, 2009 3:25 PM  Follow-up for Phone Call        Ct reviewed  spoke and reviewed status with son in CT room Was admitted for lower GI bleed and needed transfusion  No worrisome findings related to lower GI bleed Large renal cyst--likely incidental finding Will let Dr Hetty Ely review all this upon his return on monday Follow-up by: Cindee Salt MD,  November 29, 2009 3:36 PM

## 2010-09-24 NOTE — Assessment & Plan Note (Signed)
Summary: DISCUSS WEIGHT LOSS/ lb   Vital Signs:  Patient profile:   77 year old male Weight:      158.50 pounds Temp:     97.9 degrees F oral Pulse rate:   60 / minute Pulse rhythm:   regular BP sitting:   140 / 70  (left arm) Cuff size:   regular  Vitals Entered By: Sydell Axon LPN (Jan 14, 2010 11:47 AM) CC: Wants to discuss his weight loss   History of Present Illness: Pt unaware of having appt this AM.  He says he is not hungry. He eats a good brfst, one egg, grits 2 pieces of taoast and a slice of ham, jelly. He drinks water and some OJ. A typical lunch is usually not eaten. He then eats supper late afternoon at restaurant with a meat and vegetable.  He has arranged for help, a woman comes to the house Tue and Thu 10-4 ([place]) and she cooks an evening meal. He is not sure if his wife is losing weight but the seatbelt fits better. He feels better if he doesn't eat. He will drink a glass of tea with a cookie or peanut butter cracker in the middle of the day. He has seen no more blood per rectum since being here last. His children typically are here for the weekend every other weekend. He has no abd pain, BMs are nml most of the time, typically one -two a day. He has been congested lately which is more right sided. He has not had fever or chills, no headache, no ear pain, he has rhinitis that is clear to white, no ST and lots of coughing, difficult to get anything up. He is not SOB "I don't have the breath I had", no N/V, no diarrhea.  Problems Prior to Update: 1)  Diverticulosis, Colon, With Hemorrhage  (ICD-562.12) 2)  Gastrointestinal Hemorrhage, Recurrent Lower Gi  (ICD-578.9) 3)  Screening For Malignant Neoplasm, Prostate  (ICD-V76.44) 4)  Gerd  (ICD-530.81) 5)  B12 Deficiency  (ICD-266.2) 6)  Hypercholesterolemia, 278/ Ldl 208  (ICD-272.0) 7)  Hiatal Hernia Via Egd (DR. Doreatha Martin)  (ICD-553.3)  Medications Prior to Update: 1)  Cyanocobalamin 1000 Mcg/ml Soln (Cyanocobalamin)  .... Take Injection Monthly 2)  Vytorin 10-20 Mg Tabs (Ezetimibe-Simvastatin) .... Take One By Mouth Daily 3)  Tums 500 Mg Chew (Calcium Carbonate Antacid) .... Prn 4)  Pantoprazole Sodium 40 Mg Tbec (Pantoprazole Sodium) .... Take One By Mouth Daily 5)  Acetaminophen 325 Mg Tabs (Acetaminophen) .... Take 2 By Mouth Every 4 Hours As Needed Pain 6)  Metoprolol Tartrate 25 Mg Tabs (Metoprolol Tartrate) .... Take One By Mouth Two Times A Day  Allergies: 1)  ! Sulfadiazine (Sulfadiazine)  Physical Exam  General:  Well-developed,well-nourished,in no acute distress; alert,appropriate and cooperative throughout examination, thinner still. 5 pounds lighter than 4/11 Head:  Normocephalic and atraumatic without obvious abnormalities. No apparent alopecia or balding. Eyes:  Conjunctiva clear bilaterally.  Ears:  External ear exam shows no significant lesions or deformities.  Otoscopic examination reveals clear canals, tympanic membranes are intact bilaterally without bulging, retraction, inflammation or discharge. Hearing is grossly normal bilaterally. Nose:  External nasal examination shows no deformity or inflammation. Nasal mucosa are pink and moist without lesions or exudates. Mouth:  Oral mucosa and oropharynx without lesions or exudates.  Teeth in good repair. Neck:  No deformities, masses, or tenderness noted. Chest Wall:  No deformities, masses, tenderness or gynecomastia noted. Lungs:  Normal respiratory effort, chest expands  symmetrically. Lungs are clear to auscultation, no crackles or wheezes. Heart:  Normal rate and regular rhythm. S1 and S2 normal without gallop, murmur, click, rub or other extra sounds. Abdomen:  Bowel sounds positive, very active but benign sounding, abdomen soft and non-tender without masses, organomegaly or hernias noted.   Impression & Recommendations:  Problem # 1:  ANOREXIA (ICD-783.0) Assessment New Will try Remeron (Mirtazapine) as at low dose it not only  mildly helps depression but helps as an appetitie stimulant...both effects would be helpful. Pt doesn't act or verbalize depressive sxs but he "has a lot on him" and the antidepressant effect may be helpful.  Problem # 2:  DIVERTICULOSIS, COLON, WITH HEMORRHAGE (ICD-562.12) Assessment: Unchanged Seems stable. Has not seen any blod since being in the hospital.  Wanted to know if it would be allright to go to the mtns. He has a cabin up there. I think he has been stable enough to take that chance.  Problem # 3:  GERD (ICD-530.81) Assessment: Unchanged  Discussed some prophylactic steps as well as timing of medication. His updated medication list for this problem includes:    Tums 500 Mg Chew (Calcium carbonate antacid) .Marland Kitchen... As needed    Pantoprazole Sodium 40 Mg Tbec (Pantoprazole sodium) .Marland Kitchen... Take one by mouth daily  Diagnostics Reviewed:  EGD: DONE (11/22/2009) Discussed lifestyle modifications, diet, antacids/medications, and preventive measures. Handout provided.   Complete Medication List: 1)  Cyanocobalamin 1000 Mcg/ml Soln (Cyanocobalamin) .... Take injection monthly 2)  Vytorin 10-20 Mg Tabs (Ezetimibe-simvastatin) .... Take one by mouth daily 3)  Tums 500 Mg Chew (Calcium carbonate antacid) .... As needed 4)  Pantoprazole Sodium 40 Mg Tbec (Pantoprazole sodium) .... Take one by mouth daily 5)  Acetaminophen 325 Mg Tabs (Acetaminophen) .... Take 2 by mouth every 4 hours as needed pain 6)  Metoprolol Tartrate 25 Mg Tabs (Metoprolol tartrate) .... Take one by mouth two times a day 7)  Mirtazapine 15 Mg Tabs (Mirtazapine) .... 1/2 tab by mouth at night for 6 nites and then 1 tab nightly.  Patient Instructions: 1)  RTC 2 weeks. 2)  Start  Mirtazapine at night, 1/2 tab for 6 nites then go to whole tab. 3)  Take Vytorin at night. 4)  Take Pantoprazole 45 mins before brfst. Prescriptions: MIRTAZAPINE 15 MG TABS (MIRTAZAPINE) 1/2 tab by mouth at night for 6 nites and then 1 tab  nightly.  #30 x 12   Entered and Authorized by:   Shaune Leeks MD   Signed by:   Shaune Leeks MD on 01/14/2010   Method used:   Electronically to        [facility]  [address] (retail)       [address]       [place], [place]  [address]       Ph: [phone] or [phone]       Fax: [phone]   RxID:   [id number]   Current Allergies (reviewed today): ! SULFADIAZINE (SULFADIAZINE)

## 2010-09-24 NOTE — Progress Notes (Signed)
Summary: call a nurse  Phone Note Call from Patient   Caller: Patient Call For: Shaune Leeks MD Summary of Call: Triage Record Num: [id number] Operator: Jacques Navy Patient Name: Ronnie Chan Call Date & Time: 11/21/2009 7:08:35PM Patient Phone: [phone] PCP: [name] Patient Gender: Male PCP Fax : Patient DOB: [birth year] Practice Name: [facility] Reason for Call: Son/Clell calling about active bleeding in the stool large amt x 2, little tightness in chest, and dizzy. Onset 11-21-09 evening. Afebrile. Eating and drinking well. Urinating well. Activity decreased, more weak. Instructed to go to [facility] ER now. Protocol(s) Used: GI Bleeding Recommended Outcome per Protocol: See Provider within 4 hours Override Outcome if Used in Protocol: See ED Immediately RN Reason for Override Outcome: Nursing Judgement Used. Reason for Outcome: One or more episodes of rectal bleeding (more than scant) and no symptoms of hypovolemia Care Advice:  ~ List, or take, all current prescription(s), OTC or alternative medication(s) to provider for evaluation. Call EMS 911 if signs and symptoms of shock develop (such as unable to stand due to faintness, dizziness, or lightheadedness; new onset of confusion; slow to respond or difficult to awaken; skin is pale, gray, cool, or moist to touch; severe weakness; loss of consciousness).  ~  ~ DO NOT drive until consulting with provider. Call provider immediately if symptoms of hypovolemia develop including: pulse more than 100/minute, lightheaded or dizzy when rising from sitting/reclining position, shortness of breath, weakness with exertion, angina, or paleness.  ~ 11/21/2009 7:23:13PM Page 1 of 1 C Initial call taken by: Melody Comas,  November 22, 2009 1:12 PM

## 2010-09-24 NOTE — Progress Notes (Signed)
Summary: hospital wants to send pt home  Phone Note Call from Patient   Caller: Daughter in law Misty Stanley  [phone] Summary of Call: [facility], he was [facility] from [facility] care yesterday, went to a step down unit.  He has been there for GI bleed.  Hospital wants to send him home today and family disagrees.  They want him to have an abd CT before he leaves to help determine where the bleeding is coming from,  they have been unable to get in touch with the doctor who has been treating the pt at the hospital.  I told Misty Stanley that there may not be a lot that you can do to help keep the pt there, she wanted me to check with you.  She says her main concern is that pt will go home, start bleeding again and have to go back to hospital, which has already happened once, pt had been in hospital with bleeding a few days prior to this hospitalization.  Please advise. Initial call taken by: Lowella Petties CMA,  November 27, 2009 4:30 PM  Follow-up for Phone Call        I have every confidence in the TEAM in the hospital and feel their assessment of the situation is accurate. Readmitting pts is followed so discharging a pt is thought about carefully. Follow-up by: Shaune Leeks MD,  November 27, 2009 5:12 PM  Additional Follow-up for Phone Call Additional follow up Details #1::        Advised daughter in law, she understood. Additional Follow-up by: Lowella Petties CMA,  November 27, 2009 5:15 PM

## 2010-10-14 ENCOUNTER — Encounter (INDEPENDENT_AMBULATORY_CARE_PROVIDER_SITE_OTHER): Payer: Self-pay | Admitting: *Deleted

## 2010-10-14 ENCOUNTER — Other Ambulatory Visit (INDEPENDENT_AMBULATORY_CARE_PROVIDER_SITE_OTHER): Payer: Medicare Other

## 2010-10-14 ENCOUNTER — Other Ambulatory Visit: Payer: Self-pay | Admitting: Family Medicine

## 2010-10-14 DIAGNOSIS — K5731 Diverticulosis of large intestine without perforation or abscess with bleeding: Secondary | ICD-10-CM

## 2010-10-14 DIAGNOSIS — E7800 Pure hypercholesterolemia, unspecified: Secondary | ICD-10-CM

## 2010-10-14 DIAGNOSIS — E538 Deficiency of other specified B group vitamins: Secondary | ICD-10-CM

## 2010-10-14 DIAGNOSIS — Z125 Encounter for screening for malignant neoplasm of prostate: Secondary | ICD-10-CM

## 2010-10-14 LAB — CBC WITH DIFFERENTIAL/PLATELET
Lymphs Abs: 1.4 10*3/uL (ref 0.7–4.0)
MCV: 94.2 fl (ref 78.0–100.0)
Monocytes Absolute: 0.6 10*3/uL (ref 0.1–1.0)
Neutro Abs: 6.8 10*3/uL (ref 1.4–7.7)
Neutrophils Relative %: 73.5 % (ref 43.0–77.0)
Platelets: 234 10*3/uL (ref 150.0–400.0)
RBC: 4.57 Mil/uL (ref 4.22–5.81)
RDW: 13.6 % (ref 11.5–14.6)
WBC: 9.3 10*3/uL (ref 4.5–10.5)

## 2010-10-14 LAB — BASIC METABOLIC PANEL
CO2: 31 mEq/L (ref 19–32)
Chloride: 104 mEq/L (ref 96–112)
Creatinine, Ser: 1.3 mg/dL (ref 0.4–1.5)
Sodium: 142 mEq/L (ref 135–145)

## 2010-10-14 LAB — HEPATIC FUNCTION PANEL
Alkaline Phosphatase: 49 U/L (ref 39–117)
Bilirubin, Direct: 0.2 mg/dL (ref 0.0–0.3)
Total Bilirubin: 1.6 mg/dL — ABNORMAL HIGH (ref 0.3–1.2)

## 2010-10-14 LAB — TSH: TSH: 2.56 u[IU]/mL (ref 0.35–5.50)

## 2010-10-14 LAB — VITAMIN B12: Vitamin B-12: 270 pg/mL (ref 211–911)

## 2010-10-22 ENCOUNTER — Encounter: Payer: Self-pay | Admitting: Family Medicine

## 2010-10-22 ENCOUNTER — Ambulatory Visit (INDEPENDENT_AMBULATORY_CARE_PROVIDER_SITE_OTHER): Payer: Medicare Other | Admitting: Family Medicine

## 2010-10-22 DIAGNOSIS — E538 Deficiency of other specified B group vitamins: Secondary | ICD-10-CM

## 2010-10-22 DIAGNOSIS — E7800 Pure hypercholesterolemia, unspecified: Secondary | ICD-10-CM

## 2010-10-22 DIAGNOSIS — R630 Anorexia: Secondary | ICD-10-CM

## 2010-10-22 DIAGNOSIS — K5731 Diverticulosis of large intestine without perforation or abscess with bleeding: Secondary | ICD-10-CM

## 2010-10-30 NOTE — Assessment & Plan Note (Signed)
Summary: FOLLOW UP/[facility]   Vital Signs:  Patient profile:   77 year old male Weight:      154.25 pounds BMI:     23.54 Temp:     97.1 degrees F oral Pulse rate:   60 / minute Pulse rhythm:   regular BP sitting:   138 / 60  (left arm) Cuff size:   large  Vitals Entered By: Sydell Axon LPN (October 22, 2010 8:51 AM) CC: Follow-up after lab work   History of Present Illness: Pt here for three month followup from UGI bleed with anorexia. He feels he is holding his own and thinks he is eating well. He seems to be back to a normal activity level for him as he just finished walling in his front porch in [place] with [place]. 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 He has not seen blood in the stool.  He continues to care for his wife basically full time but is back again to having help twice a week with a lady who comes into the house. He saw Dr Rennis Golden at [facility] and BP was up so was started on Lisinopril. This medication had bothered him significantly so moved it to lunchtime without other meds and he tolerates this better. He has no complaints today and feels well.  Problems Prior to Update: 1)  Anorexia  (ICD-783.0) 2)  Diverticulosis, Colon, With Hemorrhage  (ICD-562.12) 3)  Gastrointestinal Hemorrhage, Recurrent Lower Gi  (ICD-578.9) 4)  Screening For Malignant Neoplasm, Prostate  (ICD-V76.44) 5)  Gerd  (ICD-530.81) 6)  B12 Deficiency  (ICD-266.2) 7)  Hypercholesterolemia, 278/ Ldl 208  (ICD-272.0) 8)  Hiatal Hernia Via Egd (DR. Doreatha Martin)  (ICD-553.3)  Medications Prior to Update: 1)  Cyanocobalamin 1000 Mcg/ml Soln (Cyanocobalamin) .... Take Injection Monthly 2)  Vytorin 10-20 Mg Tabs (Ezetimibe-Simvastatin) .... Take One By Mouth Daily 3)  Tums 500 Mg Chew (Calcium Carbonate Antacid) .... As Needed 4)  Pantoprazole Sodium 40 Mg Tbec (Pantoprazole Sodium) .... Take One By Mouth Daily 5)  Acetaminophen 325 Mg Tabs (Acetaminophen) .... Take 2 By Mouth  Every 4 Hours As Needed Pain 6)  Metoprolol Tartrate 25 Mg Tabs (Metoprolol Tartrate) .... Take One By Mouth Two Times A Day  Current Medications (verified): 1)  Cyanocobalamin 1000 Mcg/ml Soln (Cyanocobalamin) .... Take Injection Monthly 2)  Vytorin 10-20 Mg Tabs (Ezetimibe-Simvastatin) .... Take One By Mouth Daily 3)  Tums 500 Mg Chew (Calcium Carbonate Antacid) .... As Needed 4)  Pantoprazole Sodium 40 Mg Tbec (Pantoprazole Sodium) .... Take One By Mouth Daily 5)  Acetaminophen 325 Mg Tabs (Acetaminophen) .... Take 2 By Mouth Every 4 Hours As Needed Pain 6)  Metoprolol Tartrate 25 Mg Tabs (Metoprolol Tartrate) .... Take One By Mouth Two Times A Day 7)  Lisinopril 5 Mg Tabs (Lisinopril) .... Take One By Mouth Daily  Allergies: 1)  ! Sulfadiazine (Sulfadiazine)  Physical Exam  General:  Well-developed,well-nourished,in no acute distress; alert,appropriate and cooperative throughout examination, thin. Head:  Normocephalic and atraumatic without obvious abnormalities. No apparent alopecia or balding. Sinuses NT. Eyes:  Conjunctiva clear bilaterally.  Ears:  External ear exam shows no significant lesions or deformities.  Otoscopic examination reveals clear canals, tympanic membranes are intact bilaterally without bulging, retraction, inflammation or discharge. Hearing is grossly normal bilaterally. Nose:  External nasal examination shows no deformity or inflammation. Nasal mucosa are pink and moist without lesions or exudates. Mouth:  Oral mucosa and oropharynx without lesions or  exudates.  Teeth in good repair. Neck:  No deformities, masses, or tenderness noted. Chest Wall:  No deformities, masses, tenderness or gynecomastia noted. Lungs:  Normal respiratory effort, chest expands symmetrically. Lungs are clear to auscultation, no crackles or wheezes. Heart:  Normal rate and regular rhythm. S1 and S2 normal without gallop, murmur, click, rub or other extra sounds. Abdomen:  Bowel sounds  positive, very active but benign sounding, abdomen soft and non-tender without masses, organomegaly or hernias noted. Neurologic:  No cranial nerve deficits noted. Station and gait are normal.  Sensory, motor and coordinative functions appear intact. Sensation of feet decreased but still minimally intact. Psych:  Cognition and judgment appear intact. Alert and cooperative with normal attention span and concentration. No apparent delusions, illusions, hallucinations   Impression & Recommendations:  Problem # 1:  ANOREXIA (ICD-783.0) Assessment Unchanged He reports appetite nml but has lost 5 pounds since last visit three months ago. Will follow.   Problem # 2:  DIVERTICULOSIS, COLON, WITH HEMORRHAGE (ICD-562.12) Assessment: Improved  Seems nml. Abd exam nml.  Labs Reviewed: Hgb: 14.6 (10/14/2010)   Hct: 43.0 (10/14/2010)   WBC: 9.3 (10/14/2010)  Problem # 3:  B12 DEFICIENCY (ICD-266.2) Assessment: Unchanged B12 today.  Problem # 4:  HYPERCHOLESTEROLEMIA, 278/ LDL 208 (ICD-272.0) Assessment: Unchanged  Stable, good nos on current meds. His updated medication list for this problem includes:    Vytorin 10-20 Mg Tabs (Ezetimibe-simvastatin) .Marland Kitchen... Take one by mouth daily  Labs Reviewed: SGOT: 20 (10/14/2010)   SGPT: 14 (10/14/2010)   HDL:49.30 (10/14/2010), 42.3 (10/17/2008)  LDL:87 (10/14/2010), 75 (10/17/2008)  Chol:152 (10/14/2010), 133 (10/17/2008)  Trig:81.0 (10/14/2010), 78 (10/17/2008)  Complete Medication List: 1)  Cyanocobalamin 1000 Mcg/ml Soln (Cyanocobalamin) .... Take injection monthly 2)  Vytorin 10-20 Mg Tabs (Ezetimibe-simvastatin) .... Take one by mouth daily 3)  Tums 500 Mg Chew (Calcium carbonate antacid) .... As needed 4)  Pantoprazole Sodium 40 Mg Tbec (Pantoprazole sodium) .... Take one by mouth daily 5)  Acetaminophen 325 Mg Tabs (Acetaminophen) .... Take 2 by mouth every 4 hours as needed pain 6)  Metoprolol Tartrate 25 Mg Tabs (Metoprolol tartrate) .... Take  one by mouth two times a day 7)  Lisinopril 5 Mg Tabs (Lisinopril) .... Take one by mouth daily  Patient Instructions: 1)  RTC as needed, has appt for Comp Exam.   Orders Added: 1)  Est. Patient Level III [16109]    Current Allergies (reviewed today): ! SULFADIAZINE (SULFADIAZINE)  Appended Document: FOLLOW UP/[facility]   Medication Administration  Injection # 1:    Medication: Vit B12 1000 mcg    Diagnosis: B12 DEFICIENCY (ICD-266.2)    Route: IM    Site: L deltoid    Exp Date: 03/23/2012    Lot #: [id number]    Mfr: [employer]ent    Patient tolerated injection without complications    Given by: Delilah Shan CMA (AAMA) (October 22, 2010 9:36 AM)  Orders Added: 1)  Admin of Therapeutic Inj  intramuscular or subcutaneous [96372] 2)  Vit B12 1000 mcg [J3420]

## 2010-11-04 ENCOUNTER — Encounter: Payer: Self-pay | Admitting: Family Medicine

## 2010-11-12 LAB — COMPREHENSIVE METABOLIC PANEL
AST: 19 U/L (ref 0–37)
CO2: 24 mEq/L (ref 19–32)
Calcium: 8.9 mg/dL (ref 8.4–10.5)
Creatinine, Ser: 1.15 mg/dL (ref 0.4–1.5)
GFR calc Af Amer: >60 mL/min (ref >60)
GFR calc non Af Amer: >60 mL/min (ref >60)

## 2010-11-12 LAB — BASIC METABOLIC PANEL
BUN: 16 mg/dL (ref 6–23)
CO2: 24 mEq/L (ref 19–32)
CO2: 27 mEq/L (ref 19–32)
Calcium: 8.6 mg/dL (ref 8.4–10.5)
Calcium: 8.7 mg/dL (ref 8.4–10.5)
Calcium: 8.8 mg/dL (ref 8.4–10.5)
Chloride: 111 mEq/L (ref 96–112)
Creatinine, Ser: 1.19 mg/dL (ref 0.4–1.5)
Creatinine, Ser: 1.23 mg/dL (ref 0.4–1.5)
Creatinine, Ser: 1.23 mg/dL (ref 0.4–1.5)
GFR calc Af Amer: >60 mL/min (ref >60)
GFR calc Af Amer: >60 mL/min (ref >60)
GFR calc Af Amer: >60 mL/min (ref >60)
GFR calc non Af Amer: 54 mL/min — ABNORMAL LOW (ref >60)
GFR calc non Af Amer: 56 mL/min — ABNORMAL LOW (ref >60)
GFR calc non Af Amer: 56 mL/min — ABNORMAL LOW (ref >60)
GFR calc non Af Amer: 58 mL/min — ABNORMAL LOW (ref >60)
GFR calc non Af Amer: 58 mL/min — ABNORMAL LOW (ref >60)
Glucose, Bld: 88 mg/dL (ref 70–99)
Glucose, Bld: 93 mg/dL (ref 70–99)
Glucose, Bld: 95 mg/dL (ref 70–99)
Potassium: 3.8 mEq/L (ref 3.5–5.1)
Potassium: 3.8 mEq/L (ref 3.5–5.1)
Sodium: 139 mEq/L (ref 135–145)
Sodium: 139 mEq/L (ref 135–145)
Sodium: 140 mEq/L (ref 135–145)
Sodium: 141 mEq/L (ref 135–145)

## 2010-11-12 LAB — CBC
HCT: 22.4 % — ABNORMAL LOW (ref 39.0–52.0)
HCT: 31.8 % — ABNORMAL LOW (ref 39.0–52.0)
HCT: 31.9 % — ABNORMAL LOW (ref 39.0–52.0)
HCT: 32.4 % — ABNORMAL LOW (ref 39.0–52.0)
Hemoglobin: 10.9 g/dL — ABNORMAL LOW (ref 13.0–17.0)
Hemoglobin: 10.9 g/dL — ABNORMAL LOW (ref 13.0–17.0)
Hemoglobin: 11 g/dL — ABNORMAL LOW (ref 13.0–17.0)
Hemoglobin: 11.1 g/dL — ABNORMAL LOW (ref 13.0–17.0)
Hemoglobin: 11.5 g/dL — ABNORMAL LOW (ref 13.0–17.0)
Hemoglobin: 11.6 g/dL — ABNORMAL LOW (ref 13.0–17.0)
Hemoglobin: 7.7 g/dL — ABNORMAL LOW (ref 13.0–17.0)
Hemoglobin: 9.1 g/dL — ABNORMAL LOW (ref 13.0–17.0)
MCHC: 33.4 g/dL (ref 30.0–36.0)
MCHC: 34 g/dL (ref 30.0–36.0)
MCHC: 34 g/dL (ref 30.0–36.0)
MCHC: 34.2 g/dL (ref 30.0–36.0)
MCHC: 34.2 g/dL (ref 30.0–36.0)
MCHC: 34.3 g/dL (ref 30.0–36.0)
MCV: 90.1 fL (ref 78.0–100.0)
MCV: 90.9 fL (ref 78.0–100.0)
MCV: 92.8 fL (ref 78.0–100.0)
Platelets: 188 10*3/uL (ref 150–400)
Platelets: 195 10*3/uL (ref 150–400)
Platelets: 215 10*3/uL (ref 150–400)
RBC: 2.87 MIL/uL — ABNORMAL LOW (ref 4.22–5.81)
RBC: 3.07 MIL/uL — ABNORMAL LOW (ref 4.22–5.81)
RBC: 3.55 MIL/uL — ABNORMAL LOW (ref 4.22–5.81)
RBC: 3.55 MIL/uL — ABNORMAL LOW (ref 4.22–5.81)
RBC: 3.69 MIL/uL — ABNORMAL LOW (ref 4.22–5.81)
RBC: 3.75 MIL/uL — ABNORMAL LOW (ref 4.22–5.81)
RDW: 13.5 % (ref 11.5–15.5)
RDW: 15.3 % (ref 11.5–15.5)
RDW: 15.5 % (ref 11.5–15.5)
RDW: 15.6 % — ABNORMAL HIGH (ref 11.5–15.5)
RDW: 15.6 % — ABNORMAL HIGH (ref 11.5–15.5)
RDW: 15.6 % — ABNORMAL HIGH (ref 11.5–15.5)
RDW: 15.8 % — ABNORMAL HIGH (ref 11.5–15.5)
WBC: 6.6 10*3/uL (ref 4.0–10.5)
WBC: 7.4 10*3/uL (ref 4.0–10.5)
WBC: 7.5 10*3/uL (ref 4.0–10.5)
WBC: 8.2 10*3/uL (ref 4.0–10.5)
WBC: 9.3 10*3/uL (ref 4.0–10.5)

## 2010-11-12 LAB — DIFFERENTIAL
Eosinophils Relative: 3 % (ref 0–5)
Lymphocytes Relative: 13 % (ref 12–46)
Lymphs Abs: 0.9 10*3/uL (ref 0.7–4.0)
Monocytes Absolute: 0.4 10*3/uL (ref 0.1–1.0)

## 2010-11-12 LAB — CARDIAC PANEL(CRET KIN+CKTOT+MB+TROPI)
CK, MB: 7.6 ng/mL (ref 0.3–4.0)
Relative Index: 12.1 — ABNORMAL HIGH (ref 0.0–2.5)
Relative Index: INVALID (ref 0.0–2.5)
Relative Index: INVALID (ref 0.0–2.5)
Total CK: 103 U/L (ref 7–232)
Total CK: 68 U/L (ref 7–232)
Total CK: 91 U/L (ref 7–232)
Troponin I: 1.78 ng/mL (ref 0.00–0.06)
Troponin I: 1.88 ng/mL (ref 0.00–0.06)

## 2010-11-12 LAB — CK TOTAL AND CKMB (NOT AT ARMC): CK, MB: 2.7 ng/mL (ref 0.3–4.0)

## 2010-11-12 LAB — PREPARE RBC (CROSSMATCH)

## 2010-11-17 LAB — CBC
HCT: 27.4 % — ABNORMAL LOW (ref 39.0–52.0)
HCT: 28.1 % — ABNORMAL LOW (ref 39.0–52.0)
HCT: 28.6 % — ABNORMAL LOW (ref 39.0–52.0)
HCT: 42.2 % (ref 39.0–52.0)
Hemoglobin: 14.3 g/dL (ref 13.0–17.0)
Hemoglobin: 9.1 g/dL — ABNORMAL LOW (ref 13.0–17.0)
Hemoglobin: 9.4 g/dL — ABNORMAL LOW (ref 13.0–17.0)
MCHC: 33.9 g/dL (ref 30.0–36.0)
MCHC: 34 g/dL (ref 30.0–36.0)
MCHC: 34.1 g/dL (ref 30.0–36.0)
MCV: 93.4 fL (ref 78.0–100.0)
MCV: 93.5 fL (ref 78.0–100.0)
MCV: 93.7 fL (ref 78.0–100.0)
MCV: 94.1 fL (ref 78.0–100.0)
MCV: 94.4 fL (ref 78.0–100.0)
Platelets: 156 10*3/uL (ref 150–400)
Platelets: 166 10*3/uL (ref 150–400)
Platelets: 171 10*3/uL (ref 150–400)
Platelets: 183 10*3/uL (ref 150–400)
Platelets: 219 10*3/uL (ref 150–400)
RBC: 2.86 MIL/uL — ABNORMAL LOW (ref 4.22–5.81)
RBC: 3.07 MIL/uL — ABNORMAL LOW (ref 4.22–5.81)
RBC: 4.46 MIL/uL (ref 4.22–5.81)
RDW: 13.4 % (ref 11.5–15.5)
RDW: 13.4 % (ref 11.5–15.5)
WBC: 10.3 10*3/uL (ref 4.0–10.5)
WBC: 7.7 10*3/uL (ref 4.0–10.5)
WBC: 8.8 10*3/uL (ref 4.0–10.5)
WBC: 8.8 10*3/uL (ref 4.0–10.5)
WBC: 8.9 10*3/uL (ref 4.0–10.5)

## 2010-11-17 LAB — HEMOGLOBIN AND HEMATOCRIT, BLOOD
HCT: 32.1 % — ABNORMAL LOW (ref 39.0–52.0)
HCT: 34.5 % — ABNORMAL LOW (ref 39.0–52.0)
Hemoglobin: 11 g/dL — ABNORMAL LOW (ref 13.0–17.0)
Hemoglobin: 11.8 g/dL — ABNORMAL LOW (ref 13.0–17.0)
Hemoglobin: 11.8 g/dL — ABNORMAL LOW (ref 13.0–17.0)

## 2010-11-17 LAB — BASIC METABOLIC PANEL
BUN: 14 mg/dL (ref 6–23)
CO2: 25 mEq/L (ref 19–32)
CO2: 27 mEq/L (ref 19–32)
Calcium: 8.5 mg/dL (ref 8.4–10.5)
Calcium: 8.6 mg/dL (ref 8.4–10.5)
Calcium: 9.2 mg/dL (ref 8.4–10.5)
Chloride: 108 mEq/L (ref 96–112)
Chloride: 111 mEq/L (ref 96–112)
Creatinine, Ser: 1.1 mg/dL (ref 0.4–1.5)
Creatinine, Ser: 1.15 mg/dL (ref 0.4–1.5)
Creatinine, Ser: 1.19 mg/dL (ref 0.4–1.5)
GFR calc Af Amer: >60 mL/min (ref >60)
GFR calc Af Amer: >60 mL/min (ref >60)
GFR calc non Af Amer: >60 mL/min (ref >60)
Glucose, Bld: 114 mg/dL — ABNORMAL HIGH (ref 70–99)
Glucose, Bld: 120 mg/dL — ABNORMAL HIGH (ref 70–99)
Potassium: 4.5 mEq/L (ref 3.5–5.1)
Potassium: 4.7 mEq/L (ref 3.5–5.1)
Sodium: 139 mEq/L (ref 135–145)
Sodium: 141 mEq/L (ref 135–145)

## 2010-11-17 LAB — DIFFERENTIAL
Basophils Absolute: 0 10*3/uL (ref 0.0–0.1)
Basophils Relative: 0 % (ref 0–1)
Basophils Relative: 0 % (ref 0–1)
Basophils Relative: 0 % (ref 0–1)
Eosinophils Absolute: 0.2 10*3/uL (ref 0.0–0.7)
Eosinophils Relative: 1 % (ref 0–5)
Lymphocytes Relative: 7 % — ABNORMAL LOW (ref 12–46)
Lymphs Abs: 0.9 10*3/uL (ref 0.7–4.0)
Monocytes Absolute: 0.5 10*3/uL (ref 0.1–1.0)
Monocytes Absolute: 0.5 10*3/uL (ref 0.1–1.0)
Monocytes Relative: 5 % (ref 3–12)
Monocytes Relative: 6 % (ref 3–12)
Neutro Abs: 10 10*3/uL — ABNORMAL HIGH (ref 1.7–7.7)
Neutro Abs: 6.7 10*3/uL (ref 1.7–7.7)
Neutro Abs: 9.1 10*3/uL — ABNORMAL HIGH (ref 1.7–7.7)
Neutrophils Relative %: 89 % — ABNORMAL HIGH (ref 43–77)

## 2010-11-17 LAB — CROSSMATCH
ABO/RH(D): A POS
Antibody Screen: NEGATIVE

## 2010-11-17 LAB — POCT I-STAT, CHEM 8
BUN: 25 mg/dL — ABNORMAL HIGH (ref 6–23)
Creatinine, Ser: 1.1 mg/dL (ref 0.4–1.5)
Sodium: 140 mEq/L (ref 135–145)
TCO2: 24 mmol/L (ref 0–100)

## 2010-11-17 LAB — PROTIME-INR
INR: 1.21 (ref 0.00–1.49)
Prothrombin Time: 15.2 seconds (ref 11.6–15.2)

## 2010-11-17 LAB — APTT: aPTT: 28 seconds (ref 24–37)

## 2010-11-19 ENCOUNTER — Other Ambulatory Visit: Payer: Self-pay | Admitting: Family Medicine

## 2010-11-25 ENCOUNTER — Ambulatory Visit (INDEPENDENT_AMBULATORY_CARE_PROVIDER_SITE_OTHER): Payer: Medicare Other | Admitting: Family Medicine

## 2010-11-25 DIAGNOSIS — E538 Deficiency of other specified B group vitamins: Secondary | ICD-10-CM

## 2010-11-25 MED ORDER — CYANOCOBALAMIN 1000 MCG/ML IJ SOLN
1000.0000 ug | Freq: Once | INTRAMUSCULAR | Status: AC
  Administered 2010-11-25: 1000 ug via INTRAMUSCULAR

## 2010-11-25 MED ORDER — CYANOCOBALAMIN 1000 MCG/ML IJ SOLN: 1000.0000 ug | INTRAMUSCULAR | Status: AC

## 2011-01-01 ENCOUNTER — Ambulatory Visit (INDEPENDENT_AMBULATORY_CARE_PROVIDER_SITE_OTHER): Payer: Medicare Other | Admitting: Family Medicine

## 2011-01-01 ENCOUNTER — Other Ambulatory Visit: Payer: Self-pay | Admitting: Family Medicine

## 2011-01-01 ENCOUNTER — Other Ambulatory Visit: Payer: Self-pay | Admitting: *Deleted

## 2011-01-01 DIAGNOSIS — E538 Deficiency of other specified B group vitamins: Secondary | ICD-10-CM

## 2011-01-01 MED ORDER — METOPROLOL TARTRATE 25 MG PO TABS: 25.0000 mg | ORAL_TABLET | Freq: Two times a day (BID) | ORAL | Status: DC

## 2011-01-01 MED ORDER — PANTOPRAZOLE SODIUM 40 MG PO TBEC: 40.0000 mg | DELAYED_RELEASE_TABLET | Freq: Every day | ORAL | Status: DC

## 2011-01-01 MED ORDER — CYANOCOBALAMIN 1000 MCG/ML IJ SOLN
1000.0000 ug | Freq: Once | INTRAMUSCULAR | Status: AC
  Administered 2011-01-01: 1000 ug via INTRAMUSCULAR

## 2011-01-01 NOTE — Telephone Encounter (Signed)
OK to refill both for one year.

## 2011-01-01 NOTE — Telephone Encounter (Signed)
PLEASE CALL IN TO [facility], THE E-SCRIPT IS DOWN.

## 2011-01-02 NOTE — Telephone Encounter (Signed)
rx's done already

## 2011-01-13 ENCOUNTER — Other Ambulatory Visit: Payer: Self-pay | Admitting: Family Medicine

## 2011-03-12 ENCOUNTER — Other Ambulatory Visit: Payer: Self-pay | Admitting: Family Medicine

## 2011-04-02 ENCOUNTER — Telehealth: Payer: Self-pay | Admitting: *Deleted

## 2011-04-02 ENCOUNTER — Other Ambulatory Visit: Payer: Self-pay | Admitting: Family Medicine

## 2011-04-02 DIAGNOSIS — Z0000 Encounter for general adult medical examination without abnormal findings: Secondary | ICD-10-CM

## 2011-04-02 DIAGNOSIS — K5731 Diverticulosis of large intestine without perforation or abscess with bleeding: Secondary | ICD-10-CM

## 2011-04-02 DIAGNOSIS — E538 Deficiency of other specified B group vitamins: Secondary | ICD-10-CM

## 2011-04-02 DIAGNOSIS — E785 Hyperlipidemia, unspecified: Secondary | ICD-10-CM

## 2011-04-02 NOTE — Telephone Encounter (Signed)
Form filled out

## 2011-04-02 NOTE — Telephone Encounter (Signed)
Form faxed to [facility].

## 2011-04-02 NOTE — Telephone Encounter (Signed)
Prior Berkley Harvey is needed for pantoprozole, form is on your desk.

## 2011-04-09 ENCOUNTER — Other Ambulatory Visit (INDEPENDENT_AMBULATORY_CARE_PROVIDER_SITE_OTHER): Payer: Medicare Other | Admitting: Family Medicine

## 2011-04-09 DIAGNOSIS — Z0000 Encounter for general adult medical examination without abnormal findings: Secondary | ICD-10-CM

## 2011-04-09 DIAGNOSIS — K5731 Diverticulosis of large intestine without perforation or abscess with bleeding: Secondary | ICD-10-CM

## 2011-04-09 DIAGNOSIS — E538 Deficiency of other specified B group vitamins: Secondary | ICD-10-CM

## 2011-04-09 DIAGNOSIS — E785 Hyperlipidemia, unspecified: Secondary | ICD-10-CM

## 2011-04-09 LAB — RENAL FUNCTION PANEL
Albumin: 4 g/dL (ref 3.5–5.2)
BUN: 25 mg/dL — ABNORMAL HIGH (ref 6–23)
Creatinine, Ser: 1.4 mg/dL (ref 0.4–1.5)
Glucose, Bld: 98 mg/dL (ref 70–99)
Phosphorus: 2.7 mg/dL (ref 2.3–4.6)

## 2011-04-09 LAB — TSH: TSH: 1.98 u[IU]/mL (ref 0.35–5.50)

## 2011-04-09 LAB — CBC WITH DIFFERENTIAL/PLATELET
Basophils Relative: 0.1 % (ref 0.0–3.0)
Eosinophils Absolute: 0.2 10*3/uL (ref 0.0–0.7)
Lymphs Abs: 1.2 10*3/uL (ref 0.7–4.0)
MCHC: 33.2 g/dL (ref 30.0–36.0)
MCV: 94 fl (ref 78.0–100.0)
Monocytes Absolute: 0.5 10*3/uL (ref 0.1–1.0)
Neutro Abs: 5.5 10*3/uL (ref 1.4–7.7)
Neutrophils Relative %: 74.3 % (ref 43.0–77.0)
RBC: 4.4 Mil/uL (ref 4.22–5.81)

## 2011-04-09 LAB — LIPID PANEL: Cholesterol: 150 mg/dL (ref 0–200)

## 2011-04-09 LAB — HEPATIC FUNCTION PANEL
ALT: 12 U/L (ref 0–53)
Total Protein: 6.7 g/dL (ref 6.0–8.3)

## 2011-04-14 ENCOUNTER — Telehealth: Payer: Self-pay | Admitting: *Deleted

## 2011-04-14 NOTE — Telephone Encounter (Signed)
Patient's son notified as instructed by telephone. Was informed that they do have Hospice in place but they are not there full time and the son is trying to fill in when Hospice is not there.

## 2011-04-14 NOTE — Telephone Encounter (Signed)
I have never been aware of nor has it ever been raised as an issue here before that I can remember of Mr Ronnie Chan's cognitive function being diminished. 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  I thought Mrs Minella had Hospice in place per the last visit note with Dr Para March, which would mean to me care should be in place.

## 2011-04-14 NOTE — Telephone Encounter (Signed)
Pt's son is asking about pt's memory and stability issues.  He is asking how long do you think pt will be able to assist with his wife and her conditions.

## 2011-04-14 NOTE — Telephone Encounter (Signed)
Prior auth given for pantoprazole, advised pharmacy.  Approval letter placed on doctor's desk for signature and scanning. 

## 2011-04-16 ENCOUNTER — Ambulatory Visit (INDEPENDENT_AMBULATORY_CARE_PROVIDER_SITE_OTHER): Payer: Medicare Other | Admitting: Family Medicine

## 2011-04-16 ENCOUNTER — Encounter: Payer: Self-pay | Admitting: Family Medicine

## 2011-04-16 VITALS — BP 124/64 | HR 60 | Temp 97.5°F | Ht 68.0 in | Wt 158.8 lb

## 2011-04-16 DIAGNOSIS — K219 Gastro-esophageal reflux disease without esophagitis: Secondary | ICD-10-CM

## 2011-04-16 DIAGNOSIS — R630 Anorexia: Secondary | ICD-10-CM

## 2011-04-16 DIAGNOSIS — Z23 Encounter for immunization: Secondary | ICD-10-CM

## 2011-04-16 DIAGNOSIS — E7800 Pure hypercholesterolemia, unspecified: Secondary | ICD-10-CM

## 2011-04-16 DIAGNOSIS — Z0000 Encounter for general adult medical examination without abnormal findings: Secondary | ICD-10-CM

## 2011-04-16 DIAGNOSIS — E538 Deficiency of other specified B group vitamins: Secondary | ICD-10-CM

## 2011-04-16 DIAGNOSIS — K5731 Diverticulosis of large intestine without perforation or abscess with bleeding: Secondary | ICD-10-CM

## 2011-04-16 MED ORDER — TAMSULOSIN HCL 0.4 MG PO CAPS: 0.4000 mg | ORAL_CAPSULE | Freq: Every day | ORAL | Status: DC

## 2011-04-16 MED ORDER — CYANOCOBALAMIN 1000 MCG/ML IJ SOLN
1000.0000 ug | Freq: Once | INTRAMUSCULAR | Status: AC
  Administered 2011-04-16: 1000 ug via INTRAMUSCULAR

## 2011-04-16 MED ORDER — EZETIMIBE-SIMVASTATIN 10-20 MG PO TABS: 1.0000 | ORAL_TABLET | Freq: Every day | ORAL | Status: DC

## 2011-04-16 MED ORDER — SERTRALINE HCL 25 MG PO TABS: 25.0000 mg | ORAL_TABLET | Freq: Every day | ORAL | Status: DC

## 2011-04-16 NOTE — Assessment & Plan Note (Signed)
Anemia resolved. Doing reasonably well.

## 2011-04-16 NOTE — Assessment & Plan Note (Signed)
Great control on Vytorin. Cont and watch diet as best he can. 150/92/49/82.

## 2011-04-16 NOTE — Patient Instructions (Addendum)
Start Flomax daily and you should see improvement in urination. May stop this if felt not enough improvement for the money. After stable with Flomax (off or on) start Sertraline for anxiousness. Take 1/2 25mg  tablet for one week and then go to whole tablet. May stoip this anytime you feel it is not helping or not worth the money. B12 today. Pneumonia shot today. Can't afford Zostavax.

## 2011-04-16 NOTE — Assessment & Plan Note (Signed)
Low nml. Has been documented low in the past. Will have him get injection today and try to get here when he can. Anemia is resolved.

## 2011-04-16 NOTE — Assessment & Plan Note (Signed)
Stable. No sxs at present.

## 2011-04-16 NOTE — Assessment & Plan Note (Signed)
Resolved. Is eating well.

## 2011-04-16 NOTE — Progress Notes (Signed)
Subjective:    Patient ID: Ronnie Chan, male    DOB: [birth year], 77 y.o.   MRN: [id number]  HPI Pt here for Comp Exam. He has a wife at home with significant dementia now with hospice support and his son wonders if he is developing dementia as well. Hospice comes daily, nurse twice a week, aides every day for a few hours to help with bathing and dressing. He has significant hearing deficit and can't afford hearing aids. He has had a pair previously and they didn't help.  He has no complaints except significant stress with caring for his wife. He gets up at 4 AM due to stress and walks the dog. He checks on his wife 2-3 times a night.  He stopped Lisinopril as it kept him from sleeping. He is sleeping well at present. His BP today is great.  When questioned about dementia he thinks his cognitive funtion was impaired slightly when hospitalized in the past for blood from the rectum and he became anemic with some anorexia. The anorexia has essentially resolved but he does not think all of the deficit he incurred mentally has returned. He does not think this to be memory in  general as much as general mental functioning.    Review of Systems  Constitutional: Negative for fever, chills, diaphoresis, appetite change, fatigue and unexpected weight change.  HENT: Positive for hearing loss (profound hearing difficulty). Negative for ear pain, tinnitus and ear discharge.   Eyes: Negative for pain, discharge and visual disturbance.  Respiratory: Negative for cough, shortness of breath and wheezing.   Cardiovascular: Negative for chest pain and palpitations.       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  Gastrointestinal: Negative for nausea, vomiting, abdominal pain, diarrhea, constipation and blood in stool.       No heartburn but mild  swallowing problems with dry food..  Genitourinary: Negative for dysuria, frequency and difficulty urinating.       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 Negative for myalgias and back pain.  Skin: Negative for rash.       No itching or dryness.  Neurological: Negative for tremors and numbness.       No tingling or balance problems.  Hematological: Negative for adenopathy. Does not bruise/bleed easily.  Psychiatric/Behavioral: Negative for dysphoric mood and agitation.       Objective:   Physical Exam  Constitutional: He is oriented to person, place, and time. He appears well-developed and well-nourished. No distress.  HENT:  Head: Normocephalic and atraumatic.  Right Ear: External ear normal.  Left Ear: External ear normal.  Nose: Nose normal.  Mouth/Throat: Oropharynx is clear and moist.  Eyes: Conjunctivae and EOM are normal. Pupils are equal, round, and reactive to light. Right eye exhibits no discharge. Left eye exhibits no discharge. No scleral icterus.  Neck: Normal range of motion. Neck supple. No thyromegaly present.  Cardiovascular: Normal rate, regular rhythm, normal heart sounds and intact distal pulses.   No murmur heard. Pulmonary/Chest: Effort normal and breath sounds normal. No respiratory distress. He has no wheezes.  Abdominal: Soft. Bowel sounds are normal. He exhibits no distension and no mass. There is no tenderness. There is no rebound and no guarding.  Genitourinary: Rectum normal, prostate normal and penis normal. Guaiac negative stool.       No hernias felt. Prostate 20-30gms, smooth, symmetric  and firm  Musculoskeletal: Normal range  of motion. He exhibits no edema.  Lymphadenopathy:    He has no cervical adenopathy.  Neurological: He is alert and oriented to person, place, and time. Coordination normal.  Skin: Skin is warm and dry. No rash noted. He is not diaphoretic.  Psychiatric: He has a normal mood and affect. His behavior is normal. Judgment and thought content normal.          Assessment & Plan:  HMPE   I have  personally reviewed the Medicare Annual Wellness questionnaire and have noted 1. The patient's medical and social history 2. Their use of alcohol, tobacco or illicit drugs 3. Their current medications and supplements 4. The patient's functional ability including ADL's, fall risks, home safety risks and hearing or visual             impairment. 5. Diet and physical activities 6. Evidence for depression or mood disorders  Does not appear to have dementia at this time. HE is profoundly hard of hearing which can give appearance of cognitive deficit. He cannot afford hearing exam or new hearing aids at present time. I do think the new hearing aids are better and probably would help him.

## 2011-05-28 NOTE — Progress Notes (Signed)
  Subjective:    Patient ID: Ronnie Chan, male    DOB: [birth year], 77 y.o.   MRN: [id number]  HPI    Review of Systems     Objective:   Physical Exam        Assessment & Plan:  B12 given.

## 2011-07-12 ENCOUNTER — Other Ambulatory Visit: Payer: Self-pay | Admitting: Family Medicine

## 2011-08-15 IMAGING — NM NM GI BLOOD LOSS
2 series · 12 of 12 positions shown · non-contrast
Comparison: None.

CLINICAL DATA: Active GI bleeding

NUCLEAR MEDICINE GASTROINTESTINAL BLEEDING STUDY
TECHNIQUE: Sequential abdominal images were obtained following
intravenous administration of Kc-ZZm labeled red blood cells.
Radiopharmaceutical: 27.2 mCi technetium 99 tag red blood cells

[gi gi bleed · 5.01mm/px · 6 of 60 frames shown (1 of 2)]
[frame 6/60]
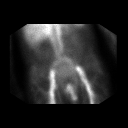
[frame 16/60]
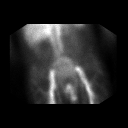
[frame 26/60]
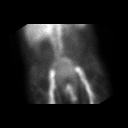
[frame 36/60]
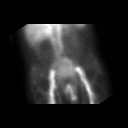
[frame 46/60]
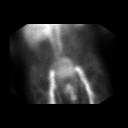
[frame 56/60]
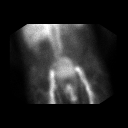

[gi gi bleed · 5.01mm/px · 6 of 60 frames shown (2 of 2)]
[frame 6/60]
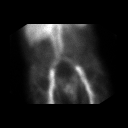
[frame 16/60]
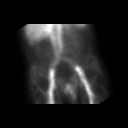
[frame 26/60]
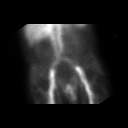
[frame 36/60]
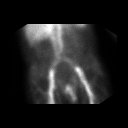
[frame 46/60]
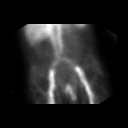
[frame 56/60]
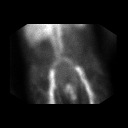

[12 of 12 positions shown; findings below may reference images not displayed]

FINDINGS: There is no evidence of intraluminal bowel activity over
the 2 hour time course of the study to suggest active
gastrointestinal bleeding.  Normal activity is noted in the blood
pool, penis, and bladder.
IMPRESSION: No evidence of active gastrointestinal bleeding during the course
of the study.

## 2011-11-26 ENCOUNTER — Telehealth: Payer: Self-pay | Admitting: *Deleted

## 2011-11-26 NOTE — Telephone Encounter (Signed)
I would stop the metoprolol totally and call back with BP update in a few days, sooner if needed.  See if patient has orthostatic symptoms and let me know if he does.  Thanks.

## 2011-11-26 NOTE — Telephone Encounter (Signed)
Son Annette Stable) advised.

## 2011-11-26 NOTE — Telephone Encounter (Signed)
Received a call from patients son Ronnie Chan) stating that the home health nurse checked his dads BP and it was 88/54, nurse is there for his mom and they check his dads BP from time to time.  Dad thinks he can just take one Metoprolol pill instead of two but Bill wants to make sure this is ok, or what does Dr. Para March think.  Please advise.

## 2011-11-30 ENCOUNTER — Telehealth: Payer: Self-pay

## 2011-11-30 NOTE — Telephone Encounter (Signed)
Pt son said pt stopped Metoprolol on 11/27/11. Pts son said BP on 11/26/11 was 142/86 in the afternoon. On 11/30/11 BP in afternoon was 115/55. Did not ck BP over weekend. No problems with dizziness or lightheadedness when stands. Overall pt feels OK pt did tell son over the weekend since stopped Metoprolol felt more tired in the mornings. Pts son Irving Burton can be reached at [phone] and pt uses [facility]. Pts son wants to know if should continue monitoring pts BP

## 2011-12-01 NOTE — Telephone Encounter (Signed)
I would stay off the medicine, would check BP periodically, 1-2 times a month.  If persistently and sig elevated (>140 systolic and >90 diastolic) or low <161/<09 or with orthostatic symptoms, then notify the clinic.  Thanks.

## 2011-12-01 NOTE — Telephone Encounter (Signed)
Left detailed message on VM of contact number given below.

## 2012-01-07 ENCOUNTER — Other Ambulatory Visit: Payer: Self-pay | Admitting: Family Medicine

## 2012-01-30 ENCOUNTER — Other Ambulatory Visit: Payer: Self-pay | Admitting: Family Medicine

## 2012-02-01 NOTE — Telephone Encounter (Signed)
Received refill request from pharmacy. Medication is no longer on med sheet. See telephone note dated 11/30/11. Called and spoke to patient and was advised that he has not taken Metoprolol in several months. Patient will call the pharmacy and tell them to take this off of automatic refill. Medication needs to be denied since patient is not taking this any longer.

## 2012-02-01 NOTE — Telephone Encounter (Signed)
Done

## 2012-05-08 ENCOUNTER — Other Ambulatory Visit: Payer: Self-pay | Admitting: Family Medicine

## 2012-05-09 NOTE — Telephone Encounter (Signed)
Electronic refill request.  Please advise. 

## 2012-05-09 NOTE — Telephone Encounter (Signed)
Electronic refill request.  Patient not seen in > 1 year, no upcoming appts scheduled.  Please advise.

## 2012-05-09 NOTE — Telephone Encounter (Signed)
Sent, please see about getting a OV this fall.  Thanks.

## 2012-05-09 NOTE — Telephone Encounter (Signed)
Patient advised. Appt scheduled. 

## 2012-05-09 NOTE — Telephone Encounter (Signed)
Sent, see about getting OV this fall.  Thanks.

## 2012-05-17 ENCOUNTER — Ambulatory Visit (INDEPENDENT_AMBULATORY_CARE_PROVIDER_SITE_OTHER): Payer: Medicare Other | Admitting: Family Medicine

## 2012-05-17 ENCOUNTER — Encounter: Payer: Self-pay | Admitting: Family Medicine

## 2012-05-17 VITALS — BP 122/60 | HR 50 | Temp 98.1°F | Wt 164.0 lb

## 2012-05-17 DIAGNOSIS — Z7189 Other specified counseling: Secondary | ICD-10-CM

## 2012-05-17 DIAGNOSIS — Z23 Encounter for immunization: Secondary | ICD-10-CM

## 2012-05-17 DIAGNOSIS — B880 Other acariasis: Secondary | ICD-10-CM

## 2012-05-17 DIAGNOSIS — I10 Essential (primary) hypertension: Secondary | ICD-10-CM

## 2012-05-17 DIAGNOSIS — K219 Gastro-esophageal reflux disease without esophagitis: Secondary | ICD-10-CM

## 2012-05-17 DIAGNOSIS — E7800 Pure hypercholesterolemia, unspecified: Secondary | ICD-10-CM

## 2012-05-17 DIAGNOSIS — Z6379 Other stressful life events affecting family and household: Secondary | ICD-10-CM

## 2012-05-17 DIAGNOSIS — Z636 Dependent relative needing care at home: Secondary | ICD-10-CM

## 2012-05-17 DIAGNOSIS — E538 Deficiency of other specified B group vitamins: Secondary | ICD-10-CM

## 2012-05-17 LAB — CBC WITH DIFFERENTIAL/PLATELET
Basophils Absolute: 0.1 10*3/uL (ref 0.0–0.1)
Eosinophils Absolute: 0.3 10*3/uL (ref 0.0–0.7)
HCT: 40.2 % (ref 39.0–52.0)
Lymphocytes Relative: 11.8 % — ABNORMAL LOW (ref 12.0–46.0)
Lymphs Abs: 1.1 10*3/uL (ref 0.7–4.0)
MCHC: 32.8 g/dL (ref 30.0–36.0)
Monocytes Relative: 6.5 % (ref 3.0–12.0)
Platelets: 202 10*3/uL (ref 150.0–400.0)
RDW: 13.6 % (ref 11.5–14.6)

## 2012-05-17 LAB — COMPREHENSIVE METABOLIC PANEL
ALT: 13 U/L (ref 0–53)
AST: 18 U/L (ref 0–37)
CO2: 27 mEq/L (ref 19–32)
GFR: 48.32 mL/min — ABNORMAL LOW (ref >60.00)
Sodium: 143 mEq/L (ref 135–145)
Total Bilirubin: 1.4 mg/dL — ABNORMAL HIGH (ref 0.3–1.2)
Total Protein: 6.6 g/dL (ref 6.0–8.3)

## 2012-05-17 LAB — LIPID PANEL
HDL: 46.6 mg/dL (ref >39.00)
LDL Cholesterol: 79 mg/dL (ref 0–99)
Total CHOL/HDL Ratio: 3
Triglycerides: 87 mg/dL (ref 0.0–149.0)

## 2012-05-17 MED ORDER — ZOSTER VACCINE LIVE 19400 UNT/0.65ML ~~LOC~~ SOLR: 0.6500 mL | Freq: Once | SUBCUTANEOUS | Status: DC

## 2012-05-17 NOTE — Progress Notes (Signed)
Ronnie Chan, wife, is bedridden at home on Hospice.  He has some help at home and is getting by with the recent developments.  He continues on SSRI.  He feels better when he's able to get out of the house and work in the yard.   Was working in the yard and then that night had a rash on the L leg.  He thought it was poison ivy, but then thought it was chiggers.  Itched.  Getting better, after finger nail polish application.    H/o B12 def and due for labs.  Off replacement currently.  H/o dec sensation on BLE.  No change per patient.   BP is controlled on home checks off meds.    Elevated Cholesterol: Using medications without problems:yes Muscle aches: no Diet compliance:yes Exercise:some Other complaints: he does have the complaint of generalized weakness, but this isn't clear if it is med related.    GERD controlled with current meds.  No ADE.    BPH. Stream is at baseline, good per patient.  Occ nocturia, stable.  No dysuria.    Advance directive.  He would want his sons to be designated if he were incapacitated.    Meds, vitals, and allergies reviewed.   ROS: See HPI.  Otherwise, noncontributory.  Nad ncat Mmm rrr ctab abd soft not ttp Likely chigger bites- resolving small red blanching papules on B feet and L thigh, lower leg Dec in sensation on BLE, stable per patient (dec to monofilament testing equally).

## 2012-05-17 NOTE — Patient Instructions (Addendum)
Check with your insurance to see if they will cover the shingles shot. Go to the lab on the way out.  We'll contact you with your lab report. Stop the vytorin for 2 weeks.  If you don't feel any better, then restart the medicine.  If you do feel much better, let me know.

## 2012-05-18 DIAGNOSIS — Z7189 Other specified counseling: Secondary | ICD-10-CM | POA: Insufficient documentation

## 2012-05-18 DIAGNOSIS — Z636 Dependent relative needing care at home: Secondary | ICD-10-CM | POA: Insufficient documentation

## 2012-05-18 DIAGNOSIS — B880 Other acariasis: Secondary | ICD-10-CM | POA: Insufficient documentation

## 2012-05-18 NOTE — Assessment & Plan Note (Signed)
Off replacement, see notes on labs.

## 2012-05-18 NOTE — Assessment & Plan Note (Signed)
Controlled, continue as is.  

## 2012-05-18 NOTE — Assessment & Plan Note (Signed)
He'll stop statin for a few weeks to see if he feels better.  If so, he'll notify me.  I'll await return call from patient. He agrees.

## 2012-05-18 NOTE — Assessment & Plan Note (Signed)
Has some help at home, continue as is with meds.  Mood stable.  No SI/Hi.

## 2012-05-18 NOTE — Assessment & Plan Note (Signed)
Resolving  °F/u prn  °

## 2012-05-19 ENCOUNTER — Encounter: Payer: Self-pay | Admitting: *Deleted

## 2012-10-30 ENCOUNTER — Other Ambulatory Visit: Payer: Self-pay | Admitting: Family Medicine

## 2012-10-31 ENCOUNTER — Other Ambulatory Visit: Payer: Self-pay | Admitting: *Deleted

## 2012-10-31 MED ORDER — SERTRALINE HCL 25 MG PO TABS: ORAL_TABLET | ORAL | Status: DC

## 2012-11-28 ENCOUNTER — Other Ambulatory Visit: Payer: Self-pay | Admitting: Family Medicine

## 2012-12-20 ENCOUNTER — Other Ambulatory Visit: Payer: Self-pay | Admitting: Family Medicine

## 2013-02-16 ENCOUNTER — Other Ambulatory Visit: Payer: Self-pay | Admitting: Family Medicine

## 2013-05-24 ENCOUNTER — Other Ambulatory Visit: Payer: Self-pay | Admitting: Family Medicine

## 2013-06-03 ENCOUNTER — Other Ambulatory Visit: Payer: Self-pay | Admitting: Family Medicine

## 2013-06-29 ENCOUNTER — Other Ambulatory Visit: Payer: Self-pay | Admitting: Family Medicine

## 2013-06-29 NOTE — Telephone Encounter (Signed)
Electronic refill request, last OV was 05/17/12, and no future appt, please advise

## 2013-06-29 NOTE — Telephone Encounter (Signed)
He is caring for his wife at home.  I would continue with the rx.  Sent.  Thanks.

## 2013-08-12 ENCOUNTER — Other Ambulatory Visit: Payer: Self-pay | Admitting: Internal Medicine

## 2013-08-12 DIAGNOSIS — E7800 Pure hypercholesterolemia, unspecified: Secondary | ICD-10-CM

## 2013-08-12 DIAGNOSIS — E538 Deficiency of other specified B group vitamins: Secondary | ICD-10-CM

## 2013-08-14 NOTE — Telephone Encounter (Signed)
Last seen 04/2012, ok to fill?

## 2013-08-14 NOTE — Telephone Encounter (Signed)
He's caring for his wife on Hospice.  I would get him in when feasible, ~09/2013.  We can do labs at an AM visit with med if fasting.  rx sent in meantime.  Thanks.

## 2013-08-14 NOTE — Telephone Encounter (Signed)
Patient advised. Appointment scheduled.  

## 2013-09-01 ENCOUNTER — Other Ambulatory Visit: Payer: Self-pay | Admitting: Family Medicine

## 2013-10-03 ENCOUNTER — Telehealth: Payer: Self-pay

## 2013-10-03 ENCOUNTER — Encounter: Payer: Self-pay | Admitting: Family Medicine

## 2013-10-03 ENCOUNTER — Ambulatory Visit (INDEPENDENT_AMBULATORY_CARE_PROVIDER_SITE_OTHER): Payer: Medicare Other | Admitting: Family Medicine

## 2013-10-03 VITALS — BP 138/78 | HR 88 | Temp 97.9°F | Wt 153.2 lb

## 2013-10-03 DIAGNOSIS — Z636 Dependent relative needing care at home: Secondary | ICD-10-CM

## 2013-10-03 DIAGNOSIS — Z6379 Other stressful life events affecting family and household: Secondary | ICD-10-CM

## 2013-10-03 DIAGNOSIS — E7800 Pure hypercholesterolemia, unspecified: Secondary | ICD-10-CM

## 2013-10-03 DIAGNOSIS — E538 Deficiency of other specified B group vitamins: Secondary | ICD-10-CM

## 2013-10-03 LAB — COMPREHENSIVE METABOLIC PANEL
ALT: 8 U/L (ref 0–53)
AST: 15 U/L (ref 0–37)
Albumin: 3.6 g/dL (ref 3.5–5.2)
Alkaline Phosphatase: 53 U/L (ref 39–117)
BUN: 26 mg/dL — ABNORMAL HIGH (ref 6–23)
CALCIUM: 9.2 mg/dL (ref 8.4–10.5)
CHLORIDE: 106 meq/L (ref 96–112)
CO2: 29 mEq/L (ref 19–32)
CREATININE: 1.3 mg/dL (ref 0.4–1.5)
GFR: 56.5 mL/min — AB (ref >60.00)
GLUCOSE: 96 mg/dL (ref 70–99)
Potassium: 4.5 mEq/L (ref 3.5–5.1)
Sodium: 140 mEq/L (ref 135–145)
TOTAL PROTEIN: 6.5 g/dL (ref 6.0–8.3)
Total Bilirubin: 1.1 mg/dL (ref 0.3–1.2)

## 2013-10-03 LAB — LIPID PANEL
CHOLESTEROL: 212 mg/dL — AB (ref 0–200)
HDL: 41.4 mg/dL (ref >39.00)
Total CHOL/HDL Ratio: 5
Triglycerides: 93 mg/dL (ref 0.0–149.0)
VLDL: 18.6 mg/dL (ref 0.0–40.0)

## 2013-10-03 LAB — VITAMIN B12: Vitamin B-12: 137 pg/mL — ABNORMAL LOW (ref 211–911)

## 2013-10-03 LAB — LDL CHOLESTEROL, DIRECT: LDL DIRECT: 159.9 mg/dL

## 2013-10-03 MED ORDER — CYANOCOBALAMIN 1000 MCG/ML IJ SOLN: 1000.0000 ug | INTRAMUSCULAR | Status: DC

## 2013-10-03 NOTE — Telephone Encounter (Signed)
Please update the med list.  He's off cholesterol medicine.  We'll address that when his labs are back. Thanks.

## 2013-10-03 NOTE — Assessment & Plan Note (Addendum)
Check a level today 

## 2013-10-03 NOTE — Assessment & Plan Note (Signed)
Discussed, he has hospice support.

## 2013-10-03 NOTE — Assessment & Plan Note (Signed)
See notes on labs. He'll check his meds at home.

## 2013-10-03 NOTE — Telephone Encounter (Signed)
Pt left v/m; pt presently taking metoprolol 25 mg,pantoprazole 40 mg,sertraline 25 mg and tamsulosin 0.4 mg. Pt was seen earlier today and was to cb with meds pt taking.

## 2013-10-03 NOTE — Patient Instructions (Addendum)
Go to the lab on the way out.  We'll contact you with your lab report. Check your med list at home and let us know if it doesn't match up.   Take care.

## 2013-10-03 NOTE — Telephone Encounter (Signed)
Medication list updated as instructed.

## 2013-10-03 NOTE — Progress Notes (Signed)
Pre-visit discussion using our clinic review tool. No additional management support is needed unless otherwise documented below in the visit note.  Pt was unclear about his meds.  He has been constantly busy looking after his wife on hospice.  He'll verify his meds when he gets home.   Due for labs today, too.   Elevated Cholesterol: Using medications without problems:yes Muscle aches: no Diet compliance:yes Exercise:no, limited by his caring for his wife.    Not SOB, no CP, no BLE edema. No FCNAVD.   He has some weight loss. This is likely related to his caring for his wife.  "I'm fixing all the food and when you fix it you don't want to eat it as much."  He's taking two cheese toast slices at breakfast, "I nibble during the day" with usually a sandwich at lunch.  He is still sleeping okay, except for a delay in going to sleep.   He's off his B12 shots, due for a level today.    Meds, vitals, and allergies reviewed.   ROS: See HPI.  Otherwise, noncontributory.  GEN: nad, alert and oriented HEENT: mucous membranes moist NECK: supple w/o LA CV: rrr.  no murmur PULM: ctab, no inc wob ABD: soft, +bs EXT: no edema SKIN: no acute rash

## 2013-10-04 ENCOUNTER — Other Ambulatory Visit: Payer: Self-pay | Admitting: Family Medicine

## 2013-10-04 MED ORDER — EZETIMIBE-SIMVASTATIN 10-20 MG PO TABS: 1.0000 | ORAL_TABLET | Freq: Every day | ORAL | Status: DC

## 2013-10-10 ENCOUNTER — Ambulatory Visit: Payer: Medicare Other

## 2013-10-17 ENCOUNTER — Ambulatory Visit: Payer: Medicare Other

## 2013-10-30 ENCOUNTER — Telehealth: Payer: Self-pay

## 2013-10-30 NOTE — Telephone Encounter (Signed)
Pt scheduled Vit B 12 injection on 03/*11/15 at 2:30 pm pt voiced understanding.

## 2013-10-31 ENCOUNTER — Other Ambulatory Visit: Payer: Self-pay | Admitting: Family Medicine

## 2013-10-31 NOTE — Telephone Encounter (Signed)
Sent!

## 2013-11-01 ENCOUNTER — Ambulatory Visit (INDEPENDENT_AMBULATORY_CARE_PROVIDER_SITE_OTHER): Payer: Medicare Other

## 2013-11-01 DIAGNOSIS — E538 Deficiency of other specified B group vitamins: Secondary | ICD-10-CM

## 2013-11-01 MED ORDER — CYANOCOBALAMIN 1000 MCG/ML IJ SOLN
1000.0000 ug | Freq: Once | INTRAMUSCULAR | Status: AC
  Administered 2013-11-01: 1000 ug via INTRAMUSCULAR

## 2013-11-30 ENCOUNTER — Ambulatory Visit (INDEPENDENT_AMBULATORY_CARE_PROVIDER_SITE_OTHER): Payer: Medicare Other

## 2013-11-30 DIAGNOSIS — E538 Deficiency of other specified B group vitamins: Secondary | ICD-10-CM

## 2013-11-30 MED ORDER — CYANOCOBALAMIN 1000 MCG/ML IJ SOLN
1000.0000 ug | Freq: Once | INTRAMUSCULAR | Status: AC
  Administered 2013-11-30: 1000 ug via INTRAMUSCULAR

## 2013-12-19 ENCOUNTER — Other Ambulatory Visit: Payer: Self-pay | Admitting: Family Medicine

## 2013-12-20 NOTE — Telephone Encounter (Signed)
Last filled 10/31/13 looks like she has 12 refills?

## 2013-12-20 NOTE — Telephone Encounter (Signed)
Clarify with pharmacy, should have refills.  Thanks.

## 2013-12-27 ENCOUNTER — Other Ambulatory Visit: Payer: Self-pay | Admitting: Family Medicine

## 2013-12-27 NOTE — Telephone Encounter (Signed)
Received refill request electronically. See allergy/contraindication. Is it okay to refill medication? 

## 2013-12-27 NOTE — Telephone Encounter (Signed)
Sent.  Has tolerated prev.

## 2014-01-02 ENCOUNTER — Ambulatory Visit (INDEPENDENT_AMBULATORY_CARE_PROVIDER_SITE_OTHER): Payer: Medicare Other

## 2014-01-02 DIAGNOSIS — E538 Deficiency of other specified B group vitamins: Secondary | ICD-10-CM

## 2014-01-02 MED ORDER — CYANOCOBALAMIN 1000 MCG/ML IJ SOLN
1000.0000 ug | Freq: Once | INTRAMUSCULAR | Status: AC
  Administered 2014-01-02: 1000 ug via INTRAMUSCULAR

## 2014-01-06 ENCOUNTER — Other Ambulatory Visit: Payer: Self-pay | Admitting: Family Medicine

## 2014-01-17 ENCOUNTER — Other Ambulatory Visit: Payer: Self-pay | Admitting: Family Medicine

## 2014-01-22 ENCOUNTER — Other Ambulatory Visit: Payer: Self-pay | Admitting: Family Medicine

## 2014-02-03 ENCOUNTER — Other Ambulatory Visit: Payer: Self-pay | Admitting: Family Medicine

## 2014-02-05 NOTE — Telephone Encounter (Signed)
Seen 09/2013, okay to continue. Sent.

## 2014-02-05 NOTE — Telephone Encounter (Signed)
Electronic refill request. Last Filled:   180 tablet 1 RF on   08/12/2013.  Patient has not been seen for CPE. Please advise.

## 2014-02-06 ENCOUNTER — Ambulatory Visit (INDEPENDENT_AMBULATORY_CARE_PROVIDER_SITE_OTHER): Payer: Medicare Other

## 2014-02-06 DIAGNOSIS — E538 Deficiency of other specified B group vitamins: Secondary | ICD-10-CM | POA: Diagnosis not present

## 2014-02-06 MED ORDER — CYANOCOBALAMIN 1000 MCG/ML IJ SOLN
1000.0000 ug | Freq: Once | INTRAMUSCULAR | Status: AC
  Administered 2014-02-06: 1000 ug via INTRAMUSCULAR

## 2014-02-22 ENCOUNTER — Encounter: Payer: Self-pay | Admitting: Family Medicine

## 2014-02-22 NOTE — Progress Notes (Signed)
Checked on patient during home visit for wife.  No acute complaints, other than the strain of caring for his wife.  Has family support, his son; transition for wife out of hospice pending.  We'll be in touch with family in the near future re: [facility] for his wife.

## 2014-03-08 ENCOUNTER — Ambulatory Visit (INDEPENDENT_AMBULATORY_CARE_PROVIDER_SITE_OTHER): Payer: Medicare Other

## 2014-03-08 DIAGNOSIS — E538 Deficiency of other specified B group vitamins: Secondary | ICD-10-CM

## 2014-03-08 MED ORDER — CYANOCOBALAMIN 1000 MCG/ML IJ SOLN
1000.0000 ug | Freq: Once | INTRAMUSCULAR | Status: AC
  Administered 2014-03-08: 1000 ug via INTRAMUSCULAR

## 2014-04-11 ENCOUNTER — Ambulatory Visit (INDEPENDENT_AMBULATORY_CARE_PROVIDER_SITE_OTHER): Payer: Medicare Other

## 2014-04-11 DIAGNOSIS — E538 Deficiency of other specified B group vitamins: Secondary | ICD-10-CM

## 2014-04-11 MED ORDER — CYANOCOBALAMIN 1000 MCG/ML IJ SOLN
1000.0000 ug | Freq: Once | INTRAMUSCULAR | Status: AC
  Administered 2014-04-11: 1000 ug via INTRAMUSCULAR

## 2014-04-18 ENCOUNTER — Encounter (HOSPITAL_COMMUNITY): Payer: Self-pay | Admitting: Emergency Medicine

## 2014-04-18 ENCOUNTER — Emergency Department (HOSPITAL_COMMUNITY)
Admission: EM | Admit: 2014-04-18 | Discharge: 2014-04-18 | Disposition: A | Discharge status: Home / Self Care | Payer: Medicare Other | Attending: Emergency Medicine | Admitting: Emergency Medicine

## 2014-04-18 DIAGNOSIS — R42 Dizziness and giddiness: Secondary | ICD-10-CM | POA: Insufficient documentation

## 2014-04-18 DIAGNOSIS — R55 Syncope and collapse: Secondary | ICD-10-CM | POA: Insufficient documentation

## 2014-04-18 DIAGNOSIS — R112 Nausea with vomiting, unspecified: Secondary | ICD-10-CM | POA: Insufficient documentation

## 2014-04-18 DIAGNOSIS — Z8719 Personal history of other diseases of the digestive system: Secondary | ICD-10-CM | POA: Insufficient documentation

## 2014-04-18 DIAGNOSIS — R82998 Other abnormal findings in urine: Secondary | ICD-10-CM | POA: Diagnosis not present

## 2014-04-18 DIAGNOSIS — Z8673 Personal history of transient ischemic attack (TIA), and cerebral infarction without residual deficits: Secondary | ICD-10-CM | POA: Insufficient documentation

## 2014-04-18 DIAGNOSIS — R8281 Pyuria: Secondary | ICD-10-CM

## 2014-04-18 DIAGNOSIS — Z79899 Other long term (current) drug therapy: Secondary | ICD-10-CM | POA: Insufficient documentation

## 2014-04-18 LAB — CBC WITH DIFFERENTIAL/PLATELET
BASOS PCT: 0 % (ref 0–1)
Basophils Absolute: 0 10*3/uL (ref 0.0–0.1)
EOS ABS: 0.2 10*3/uL (ref 0.0–0.7)
Eosinophils Relative: 2 % (ref 0–5)
HCT: 37.5 % — ABNORMAL LOW (ref 39.0–52.0)
Hemoglobin: 12.7 g/dL — ABNORMAL LOW (ref 13.0–17.0)
LYMPHS ABS: 0.8 10*3/uL (ref 0.7–4.0)
Lymphocytes Relative: 8 % — ABNORMAL LOW (ref 12–46)
MCH: 30.4 pg (ref 26.0–34.0)
MCHC: 33.9 g/dL (ref 30.0–36.0)
MCV: 89.7 fL (ref 78.0–100.0)
Monocytes Absolute: 0.6 10*3/uL (ref 0.1–1.0)
Monocytes Relative: 6 % (ref 3–12)
Neutro Abs: 8.3 10*3/uL — ABNORMAL HIGH (ref 1.7–7.7)
Neutrophils Relative %: 84 % — ABNORMAL HIGH (ref 43–77)
PLATELETS: 186 10*3/uL (ref 150–400)
RBC: 4.18 MIL/uL — AB (ref 4.22–5.81)
RDW: 13.2 % (ref 11.5–15.5)
WBC: 9.9 10*3/uL (ref 4.0–10.5)

## 2014-04-18 LAB — COMPREHENSIVE METABOLIC PANEL
ALT: 5 U/L (ref 0–53)
ANION GAP: 11 (ref 5–15)
AST: 12 U/L (ref 0–37)
Albumin: 3.3 g/dL — ABNORMAL LOW (ref 3.5–5.2)
Alkaline Phosphatase: 50 U/L (ref 39–117)
BUN: 24 mg/dL — ABNORMAL HIGH (ref 6–23)
CALCIUM: 9.1 mg/dL (ref 8.4–10.5)
CO2: 26 mEq/L (ref 19–32)
Chloride: 103 mEq/L (ref 96–112)
Creatinine, Ser: 1.42 mg/dL — ABNORMAL HIGH (ref 0.50–1.35)
GFR calc non Af Amer: 43 mL/min — ABNORMAL LOW (ref >90)
GFR, EST AFRICAN AMERICAN: 50 mL/min — AB (ref >90)
GLUCOSE: 113 mg/dL — AB (ref 70–99)
Potassium: 4.5 mEq/L (ref 3.7–5.3)
SODIUM: 140 meq/L (ref 137–147)
Total Bilirubin: 1.1 mg/dL (ref 0.3–1.2)
Total Protein: 6 g/dL (ref 6.0–8.3)

## 2014-04-18 LAB — URINALYSIS, ROUTINE W REFLEX MICROSCOPIC
Bilirubin Urine: NEGATIVE
Glucose, UA: NEGATIVE mg/dL
Ketones, ur: NEGATIVE mg/dL
NITRITE: NEGATIVE
PROTEIN: NEGATIVE mg/dL
Specific Gravity, Urine: 1.016 (ref 1.005–1.030)
UROBILINOGEN UA: 1 mg/dL (ref 0.0–1.0)
pH: 7 (ref 5.0–8.0)

## 2014-04-18 LAB — LIPASE, BLOOD: Lipase: 37 U/L (ref 11–59)

## 2014-04-18 LAB — TROPONIN I: Troponin I: <0.3 ng/mL (ref <0.30)

## 2014-04-18 LAB — URINE MICROSCOPIC-ADD ON

## 2014-04-18 LAB — CBG MONITORING, ED: Glucose-Capillary: 114 mg/dL — ABNORMAL HIGH (ref 70–99)

## 2014-04-18 MED ORDER — CEPHALEXIN 500 MG PO CAPS: 500.0000 mg | ORAL_CAPSULE | Freq: Two times a day (BID) | ORAL | Status: DC

## 2014-04-18 MED ORDER — SODIUM CHLORIDE 0.9 % IV BOLUS (SEPSIS)
1000.0000 mL | Freq: Once | INTRAVENOUS | Status: AC
  Administered 2014-04-18: 1000 mL via INTRAVENOUS

## 2014-04-18 NOTE — ED Provider Notes (Signed)
CSN: [id number]     Arrival date & time 04/18/14  1014 History   First MD Initiated Contact with Patient 04/18/14 1017     Chief Complaint  Patient presents with  . Near Syncope     (Consider location/radiation/quality/duration/timing/severity/associated sxs/prior Treatment) HPI Comments: Patient is an 80 yo M PMHx significant for TIA, GI bleed presenting to the ED for a near syncopal episode. Patient states he had just finished bathing his wife when he became lightheaded developed nausea and one episode of non-bloody non-bilious emesis. Patient sat down and a few minutes later the symptoms resolved. Denies any LOC. Denies any precipitating CP, SOB, HA, abdominal pain, diarrhea, constipation. Does endorse he did not drink much water yesterday. No recent medication changes.   Patient is a 80 y.o. male presenting with near-syncope.  Near Syncope Associated symptoms include nausea and vomiting. Pertinent negatives include no abdominal pain, chest pain, numbness or weakness.    Past Medical History  Diagnosis Date  . history of mini stroke 62    Dr. Earley Favor  . Diverticulitis of colon with bleeding 03/11    Hospital 03/25-03/29/11, ARF due dehydr, acute blood loss anemia due divertic bleed  . Lower GI bleed 04/11    Hospital 04/01-04/06/11, acute blood loss anemia, transfusion 4 units PRBC's, non stemi  . History of CT scan of abdomen 11/29/09    No acute process, large R parapelvic renal cystt calcull  . History of ETT 11/24/09    myoview, nml EF 48%   Past Surgical History  Procedure Laterality Date  . Tracheostomy  7 yoa    Strep throat  . Septoplasty  1990    Dr. Ernesto Rutherford, left  . Cataract extraction, bilateral  09/1997- 10/1997   Family History  Problem Relation Age of Onset  . Heart disease Father     CHF  . Heart disease Paternal Grandfather     heart attacks   History  Substance Use Topics  . Smoking status: Never Smoker   . Smokeless tobacco: Never Used  .  Alcohol Use: No    Review of Systems  Respiratory: Negative for shortness of breath.   Cardiovascular: Positive for near-syncope. Negative for chest pain.  Gastrointestinal: Positive for nausea and vomiting. Negative for abdominal pain, diarrhea, constipation, blood in stool, abdominal distention and anal bleeding.  Neurological: Positive for light-headedness. Negative for syncope, weakness and numbness.  All other systems reviewed and are negative.     Allergies  Lisinopril and Sulfadiazine  Home Medications   Prior to Admission medications   Medication Sig Start Date End Date Taking? Authorizing Provider  metoprolol tartrate (LOPRESSOR) 25 MG tablet Take 25 mg by mouth 2 (two) times daily.   Yes Historical Provider, MD  pantoprazole (PROTONIX) 40 MG tablet Take 1 tablet (40 mg total) by mouth daily. 01/01/11  Yes Modesto Charon, MD  sertraline (ZOLOFT) 25 MG tablet Take 25 mg by mouth daily.   Yes Historical Provider, MD  tamsulosin (FLOMAX) 0.4 MG CAPS capsule Take 0.4 mg by mouth daily after breakfast.   Yes Historical Provider, MD  cephALEXin (KEFLEX) 500 MG capsule Take 1 capsule (500 mg total) by mouth 2 (two) times daily. 04/18/14   Shloimy Michalski L Shariff Lasky, PA-C   BP 140/51  Pulse 72  Resp 16  SpO2 98% Physical Exam  Nursing note and vitals reviewed. Constitutional: He is oriented to person, place, and time. He appears well-developed and well-nourished. No distress.  HENT:  Head: Normocephalic and atraumatic.  Right Ear: External ear normal.  Left Ear: External ear normal.  Nose: Nose normal.  Mouth/Throat: Oropharynx is clear and moist. No oropharyngeal exudate.  Eyes: Conjunctivae and EOM are normal. Pupils are equal, round, and reactive to light.  Neck: Normal range of motion. Neck supple.  Cardiovascular: Normal rate, regular rhythm, normal heart sounds and intact distal pulses.   Pulmonary/Chest: Effort normal and breath sounds normal. No respiratory distress.   Abdominal: Soft. Bowel sounds are normal. He exhibits no distension. There is no tenderness. There is no rebound and no guarding.  Musculoskeletal: Normal range of motion. He exhibits no edema.  Neurological: He is alert and oriented to person, place, and time. He has normal strength. No cranial nerve deficit. Gait normal. GCS eye subscore is 4. GCS verbal subscore is 5. GCS motor subscore is 6.  Sensation grossly intact.  No pronator drift.  Bilateral heel-knee-shin intact.  Skin: Skin is warm and dry. He is not diaphoretic.    ED Course  Procedures (including critical care time) Medications  sodium chloride 0.9 % bolus 1,000 mL (0 mLs Intravenous Stopped 04/18/14 1234)    Labs Review Labs Reviewed  CBC WITH DIFFERENTIAL - Abnormal; Notable for the following:    RBC 4.18 (*)    Hemoglobin 12.7 (*)    HCT 37.5 (*)    Neutrophils Relative % 84 (*)    Neutro Abs 8.3 (*)    Lymphocytes Relative 8 (*)    All other components within normal limits  COMPREHENSIVE METABOLIC PANEL - Abnormal; Notable for the following:    Glucose, Bld 113 (*)    BUN 24 (*)    Creatinine, Ser 1.42 (*)    Albumin 3.3 (*)    GFR calc non Af Amer 43 (*)    GFR calc Af Amer 50 (*)    All other components within normal limits  URINALYSIS, ROUTINE W REFLEX MICROSCOPIC - Abnormal; Notable for the following:    Hgb urine dipstick SMALL (*)    Leukocytes, UA TRACE (*)    All other components within normal limits  CBG MONITORING, ED - Abnormal; Notable for the following:    Glucose-Capillary 114 (*)    All other components within normal limits  URINE CULTURE  TROPONIN I  LIPASE, BLOOD  URINE MICROSCOPIC-ADD ON  POCT CBG (FASTING - GLUCOSE)-MANUAL ENTRY    Imaging Review No results found.   EKG Interpretation None      MDM   Final diagnoses:  Near syncope  Pyuria    Filed Vitals:   04/18/14 1307  BP: 140/51  Pulse: 72  Resp: 16   Afebrile, NAD, non-toxic appearing, AAOx4. No neuro  focal deficits on examination. Patient with near-syncopal episodes one episode of nausea and nonbloody vomiting. No syncope. Labs and EKG reviewed.Pyuria noted on UA. Discussed possible admission for further evaluation of near-syncopal episode with patient , patient would prefer to be discharged home with followup with his primary care physician and return if he develops any returning or worsening symptoms. Understands risks of going home rather than staying for further evaluation. Return precautions discussed. Patient is agreeable to plan. Patient is stable at time of discharge Patient d/w with Dr. Regenia Skeeter, agrees with plan.      Harlow Mares, PA-C 04/18/14 1604

## 2014-04-18 NOTE — Discharge Instructions (Signed)
Please follow up with your primary care physician in 1-2 days. If you do not have one please call the [facility] number listed above. If you develop any recurrent symptoms please return to the ER. Please take your antibiotic until completion. Please read all discharge instructions and return precautions.    Near-Syncope Near-syncope (commonly known as near fainting) is sudden weakness, dizziness, or feeling like you might pass out. During an episode of near-syncope, you may also develop pale skin, have tunnel vision, or feel sick to your stomach (nauseous). Near-syncope may occur when getting up after sitting or while standing for a long time. It is caused by a sudden decrease in blood flow to the brain. This decrease can result from various causes or triggers, most of which are not serious. However, because near-syncope can sometimes be a sign of something serious, a medical evaluation is required. The specific cause is often not determined. HOME CARE INSTRUCTIONS  Monitor your condition for any changes. The following actions may help to alleviate any discomfort you are experiencing:  Have someone stay with you until you feel stable.  Lie down right away and prop your feet up if you start feeling like you might faint. Breathe deeply and steadily. Wait until all the symptoms have passed. Most of these episodes last only a few minutes. You may feel tired for several hours.   Drink enough fluids to keep your urine clear or pale yellow.   If you are taking blood pressure or heart medicine, get up slowly when seated or lying down. Take several minutes to sit and then stand. This can reduce dizziness.  Follow up with your health care provider as directed. SEEK IMMEDIATE MEDICAL CARE IF:   You have a severe headache.   You have unusual pain in the chest, abdomen, or back.   You are bleeding from the mouth or rectum, or you have black or tarry stool.   You have an  irregular or very fast heartbeat.   You have repeated fainting or have seizure-like jerking during an episode.   You faint when sitting or lying down.   You have confusion.   You have difficulty walking.   You have severe weakness.   You have vision problems.  MAKE SURE YOU:   Understand these instructions.  Will watch your condition.  Will get help right away if you are not doing well or get worse. Document Released: 08/10/2005 Document Revised: 08/15/2013 Document Reviewed: 01/13/2013 [facility] Patient Information 2015 [place], [place]. This information is not intended to replace advice given to you by your health care provider. Make sure you discuss any questions you have with your health care provider. Urinary Tract Infection Urinary tract infections (UTIs) can develop anywhere along your urinary tract. Your urinary tract is your body's drainage system for removing wastes and extra water. Your urinary tract includes two kidneys, two ureters, a bladder, and a urethra. Your kidneys are a pair of bean-shaped organs. Each kidney is about the size of your fist. They are located below your ribs, one on each side of your spine. CAUSES Infections are caused by microbes, which are microscopic organisms, including fungi, viruses, and bacteria. These organisms are so small that they can only be seen through a microscope. Bacteria are the microbes that most commonly cause UTIs. SYMPTOMS  Symptoms of UTIs may vary by age and gender of the patient and by the location of the infection. Symptoms in young women typically include a frequent  and intense urge to urinate and a painful, burning feeling in the bladder or urethra during urination. Older women and men are more likely to be tired, shaky, and weak and have muscle aches and abdominal pain. A fever may mean the infection is in your kidneys. Other symptoms of a kidney infection include pain in your back or sides below the ribs, nausea, and  vomiting. DIAGNOSIS To diagnose a UTI, your caregiver will ask you about your symptoms. Your caregiver also will ask to provide a urine sample. The urine sample will be tested for bacteria and white blood cells. White blood cells are made by your body to help fight infection. TREATMENT  Typically, UTIs can be treated with medication. Because most UTIs are caused by a bacterial infection, they usually can be treated with the use of antibiotics. The choice of antibiotic and length of treatment depend on your symptoms and the type of bacteria causing your infection. HOME CARE INSTRUCTIONS  If you were prescribed antibiotics, take them exactly as your caregiver instructs you. Finish the medication even if you feel better after you have only taken some of the medication.  Drink enough water and fluids to keep your urine clear or pale yellow.  Avoid caffeine, tea, and carbonated beverages. They tend to irritate your bladder.  Empty your bladder often. Avoid holding urine for long periods of time.  Empty your bladder before and after sexual intercourse.  After a bowel movement, women should cleanse from front to back. Use each tissue only once. SEEK MEDICAL CARE IF:   You have back pain.  You develop a fever.  Your symptoms do not begin to resolve within 3 days. SEEK IMMEDIATE MEDICAL CARE IF:   You have severe back pain or lower abdominal pain.  You develop chills.  You have nausea or vomiting.  You have continued burning or discomfort with urination. MAKE SURE YOU:   Understand these instructions.  Will watch your condition.  Will get help right away if you are not doing well or get worse. Document Released: 05/20/2005 Document Revised: 02/09/2012 Document Reviewed: 09/18/2011 [facility] Patient Information 2015 [place], [place]. This information is not intended to replace advice given to you by your health care provider. Make sure you discuss any questions you have with your health  care provider.

## 2014-04-18 NOTE — ED Provider Notes (Signed)
Medical screening examination/treatment/procedure(s) were conducted as a shared visit with non-physician practitioner(s) and myself.  I personally evaluated the patient during the encounter.   EKG Interpretation None       Patient with near syncopal episode. No inciting factors. Workup here benign, recommended admission overnight but patient declines. D/C home in stable condition.  Ephraim Hamburger, MD 04/19/14 [phone]

## 2014-04-18 NOTE — ED Notes (Signed)
Per EMS, pt was at home with his wife and wife's caregiver when he had a weak spell. He became very pale and weak, one vomitus episode. Pt denies chest pain or LOC. Pt alert and oriented at this time.

## 2014-04-19 ENCOUNTER — Ambulatory Visit (INDEPENDENT_AMBULATORY_CARE_PROVIDER_SITE_OTHER): Payer: Medicare Other | Admitting: Family Medicine

## 2014-04-19 ENCOUNTER — Encounter: Payer: Self-pay | Admitting: Family Medicine

## 2014-04-19 VITALS — BP 108/42 | HR 62 | Temp 98.0°F | Wt 149.5 lb

## 2014-04-19 DIAGNOSIS — R55 Syncope and collapse: Secondary | ICD-10-CM

## 2014-04-19 LAB — URINE CULTURE: Colony Count: 40000

## 2014-04-19 NOTE — Assessment & Plan Note (Signed)
Likely from relative dehydration, BB, possible UTI, positional influence.  D/w pt.  Talked with pt re: stopping metoprolol and drinking more water.  Finish abx and f/u prn.  He agrees.

## 2014-04-19 NOTE — Patient Instructions (Signed)
Stop the metoprolol for now. Drink more water, less pepsi.  Keep taking the keflex until you finish the all the pills.   Let me know about how you feel next week.

## 2014-04-19 NOTE — Progress Notes (Signed)
Pre visit review using our clinic review tool, if applicable. No additional management support is needed unless otherwise documented below in the visit note.  Yesterday he was presyncopal, was seen at ER.  Notes reviewed.  No CP, no full syncope but did vomit.  He had been more lightheaded recently, over the last few weeks.  He was bending over yesterday, before the sx started.  Now on keflex for presumed UTI.  Diet is variable, d/w pt about trying to get square meals.  D/w pt about cutting back on pepsi, drinking more water.    Meds, vitals, and allergies reviewed.   ROS: See HPI.  Otherwise, noncontributory.  GEN: nad, alert and oriented HEENT: mucous membranes moist NECK: supple w/o LA CV: rrr.  PULM: ctab, no inc wob ABD: soft, +bs EXT: no edema SKIN: no acute rash

## 2014-05-02 ENCOUNTER — Other Ambulatory Visit: Payer: Self-pay | Admitting: Family Medicine

## 2014-05-02 MED ORDER — TAMSULOSIN HCL 0.4 MG PO CAPS: 0.4000 mg | ORAL_CAPSULE | Freq: Every day | ORAL | Status: DC

## 2014-05-02 MED ORDER — PANTOPRAZOLE SODIUM 40 MG PO TBEC: 40.0000 mg | DELAYED_RELEASE_TABLET | Freq: Every day | ORAL | Status: DC

## 2014-05-02 MED ORDER — SERTRALINE HCL 25 MG PO TABS: 25.0000 mg | ORAL_TABLET | Freq: Every day | ORAL | Status: DC

## 2014-05-02 NOTE — Telephone Encounter (Signed)
Last filled 10/31/13

## 2014-05-02 NOTE — Telephone Encounter (Signed)
Sent. Thanks.   

## 2014-05-16 ENCOUNTER — Ambulatory Visit (INDEPENDENT_AMBULATORY_CARE_PROVIDER_SITE_OTHER): Payer: Medicare Other

## 2014-05-16 DIAGNOSIS — E538 Deficiency of other specified B group vitamins: Secondary | ICD-10-CM | POA: Diagnosis not present

## 2014-05-16 MED ORDER — CYANOCOBALAMIN 1000 MCG/ML IJ SOLN
1000.0000 ug | Freq: Once | INTRAMUSCULAR | Status: AC
  Administered 2014-05-16: 1000 ug via INTRAMUSCULAR

## 2014-06-19 ENCOUNTER — Ambulatory Visit (INDEPENDENT_AMBULATORY_CARE_PROVIDER_SITE_OTHER): Payer: Medicare Other

## 2014-06-19 DIAGNOSIS — E538 Deficiency of other specified B group vitamins: Secondary | ICD-10-CM

## 2014-06-19 MED ORDER — CYANOCOBALAMIN 1000 MCG/ML IJ SOLN
1000.0000 ug | Freq: Once | INTRAMUSCULAR | Status: AC
  Administered 2014-06-19: 1000 ug via INTRAMUSCULAR

## 2014-07-26 ENCOUNTER — Ambulatory Visit: Payer: Medicare Other

## 2014-07-26 ENCOUNTER — Ambulatory Visit (INDEPENDENT_AMBULATORY_CARE_PROVIDER_SITE_OTHER): Payer: Medicare Other | Admitting: Family Medicine

## 2014-07-26 ENCOUNTER — Encounter: Payer: Self-pay | Admitting: Family Medicine

## 2014-07-26 VITALS — BP 124/60 | HR 76 | Temp 97.6°F | Ht 69.0 in | Wt 145.2 lb

## 2014-07-26 DIAGNOSIS — E538 Deficiency of other specified B group vitamins: Secondary | ICD-10-CM

## 2014-07-26 DIAGNOSIS — Z0000 Encounter for general adult medical examination without abnormal findings: Secondary | ICD-10-CM

## 2014-07-26 DIAGNOSIS — E7800 Pure hypercholesterolemia, unspecified: Secondary | ICD-10-CM

## 2014-07-26 DIAGNOSIS — Z636 Dependent relative needing care at home: Secondary | ICD-10-CM

## 2014-07-26 LAB — VITAMIN B12

## 2014-07-26 MED ORDER — SIMVASTATIN 20 MG PO TABS
20.0000 mg | ORAL_TABLET | Freq: Every day | ORAL | Status: DC
Start: 2014-07-26 — End: 2015-10-20

## 2014-07-26 MED ORDER — CYANOCOBALAMIN 1000 MCG/ML IJ SOLN
1000.0000 ug | Freq: Once | INTRAMUSCULAR | Status: AC
  Administered 2014-07-26: 1000 ug via INTRAMUSCULAR

## 2014-07-26 NOTE — Progress Notes (Signed)
Pre visit review using our clinic review tool, if applicable. No additional management support is needed unless otherwise documented below in the visit note.  I have personally reviewed the Medicare Annual Wellness questionnaire and have noted 1. The patient's medical and social history 2. Their use of alcohol, tobacco or illicit drugs 3. Their current medications and supplements 4. The patient's functional ability including ADL's, fall risks, home safety risks and hearing or visual             impairment. 5. Diet and physical activities 6. Evidence for depression or mood disorders  The patients weight, height, BMI have been recorded in the chart and visual acuity is per eye clinic.  I have made referrals, counseling and provided education to the patient based review of the above and I have provided the pt with a written personalized care plan for preventive services.  Provider list updated- see scanned forms.  Routine anticipatory guidance given to patient.  See health maintenance.  Flu 2015 Shingles d/w pt.  PNA 2012 Tetanus 2014 Colonoscopy NA due to age.  Prostate cancer screening declined, NA due to age.  Advance directive- either son designated if patient were incapacitated.  Cognitive function addressed- see scanned forms- and if abnormal then additional documentation follows.   B 12 def, back on replacement, due for f/u labs.  No ADE on meds.   Elevated Cholesterol: Using medications without problems: off vytorin due to cost Muscle aches: not prev when on med Diet compliance:yes Exercise:some, walking D/w pt about plain simva trial.   Caregiver strain, still caring for his wife.  D/w pt.  He has some help at home.    PMH and SH reviewed  Meds, vitals, and allergies reviewed.   ROS: See HPI.  Otherwise negative.    GEN: nad, alert and oriented HEENT: mucous membranes moist NECK: supple w/o LA CV: rrr. PULM: ctab, no inc wob ABD: soft, +bs EXT: no edema SKIN: no  acute rash

## 2014-07-26 NOTE — Patient Instructions (Signed)
Go to the lab on the way out.  We'll contact you with your lab report. Start back on the simvastatin (cholesterol medicine).  I sent it to the pharmacy.   Take care.  Glad to see you.

## 2014-07-29 DIAGNOSIS — Z0000 Encounter for general adult medical examination without abnormal findings: Secondary | ICD-10-CM | POA: Insufficient documentation

## 2014-07-29 NOTE — Assessment & Plan Note (Signed)
Flu 2015 Shingles d/w pt.  PNA 2012 Tetanus 2014 Colonoscopy NA due to age.  Prostate cancer screening declined, NA due to age.  Advance directive- either son designated if patient were incapacitated.  Cognitive function addressed- see scanned forms- and if abnormal then additional documentation follows.

## 2014-07-29 NOTE — Assessment & Plan Note (Signed)
Will retry plain simva, we can recheck lipids later on. He agrees.

## 2014-07-29 NOTE — Assessment & Plan Note (Signed)
See notes on lab.

## 2014-07-29 NOTE — Assessment & Plan Note (Signed)
He is getting by, with some support.  Wife is continuing slow decline from dementia.

## 2014-08-30 ENCOUNTER — Ambulatory Visit: Payer: Medicare Other | Admitting: Family Medicine

## 2014-10-09 ENCOUNTER — Ambulatory Visit: Payer: Medicare Other | Admitting: Family Medicine

## 2014-10-09 ENCOUNTER — Ambulatory Visit (INDEPENDENT_AMBULATORY_CARE_PROVIDER_SITE_OTHER): Payer: Medicare Other | Admitting: *Deleted

## 2014-10-09 DIAGNOSIS — E538 Deficiency of other specified B group vitamins: Secondary | ICD-10-CM | POA: Diagnosis not present

## 2014-10-09 MED ORDER — CYANOCOBALAMIN 1000 MCG/ML IJ SOLN
1000.0000 ug | Freq: Once | INTRAMUSCULAR | Status: AC
  Administered 2014-10-09: 1000 ug via INTRAMUSCULAR

## 2014-11-17 ENCOUNTER — Other Ambulatory Visit: Payer: Self-pay | Admitting: Family Medicine

## 2014-12-06 ENCOUNTER — Ambulatory Visit (INDEPENDENT_AMBULATORY_CARE_PROVIDER_SITE_OTHER): Payer: Medicare Other | Admitting: *Deleted

## 2014-12-06 DIAGNOSIS — D519 Vitamin B12 deficiency anemia, unspecified: Secondary | ICD-10-CM

## 2014-12-06 MED ORDER — CYANOCOBALAMIN 1000 MCG/ML IJ SOLN
1000.0000 ug | Freq: Once | INTRAMUSCULAR | Status: AC
  Administered 2014-12-06: 1000 ug via INTRAMUSCULAR

## 2014-12-17 ENCOUNTER — Other Ambulatory Visit: Payer: Self-pay | Admitting: Family Medicine

## 2014-12-17 NOTE — Telephone Encounter (Signed)
Okay to continue.  Sent.  Thanks. 

## 2014-12-17 NOTE — Telephone Encounter (Signed)
Received refill request electronically. See allergy/contraindication. Last office visit 07/26/14. Is it okay to refill medication?

## 2015-01-22 ENCOUNTER — Ambulatory Visit (INDEPENDENT_AMBULATORY_CARE_PROVIDER_SITE_OTHER): Payer: Medicare Other | Admitting: Family Medicine

## 2015-01-22 ENCOUNTER — Encounter: Payer: Self-pay | Admitting: Family Medicine

## 2015-01-22 VITALS — BP 140/60 | HR 81 | Temp 98.3°F | Wt 150.5 lb

## 2015-01-22 DIAGNOSIS — R059 Cough, unspecified: Secondary | ICD-10-CM

## 2015-01-22 DIAGNOSIS — R05 Cough: Secondary | ICD-10-CM

## 2015-01-22 MED ORDER — DOXYCYCLINE HYCLATE 100 MG PO TABS: 100.0000 mg | ORAL_TABLET | Freq: Two times a day (BID) | ORAL | Status: DC

## 2015-01-22 NOTE — Progress Notes (Signed)
Pre visit review using our clinic review tool, if applicable. No additional management support is needed unless otherwise documented below in the visit note.  He was doing okay until about 1 week ago.  He had been walking the loop at his home, about 1 mile a day at baseline.  Starting in the last week, he noted more cough, didn't feel well.  Some sputum.  No fevers, temp last PM was not elevated.  He has a h/o shoulder pain at baseline.  He got fatigued but not SOB walking up a hill recently.  He isn't SOB.   Meds, vitals, and allergies reviewed.   ROS: See HPI.  Otherwise, noncontributory.  GEN: nad, alert and oriented HEENT: mucous membranes moist, tm w/o erythema, nasal exam w/o erythema, clear discharge noted,  OP with cobblestoning NECK: supple w/o LA CV: rrr.   PULM: coarse BS B but still ctab o/w, no inc wob EXT: no edema Hard of hearing at baseline

## 2015-01-22 NOTE — Patient Instructions (Signed)
Start doxycycline today.  Get some rest.  Drink plenty of fluids.  Take care.  Glad to see you.

## 2015-01-24 DIAGNOSIS — R05 Cough: Secondary | ICD-10-CM | POA: Insufficient documentation

## 2015-01-24 DIAGNOSIS — R059 Cough, unspecified: Secondary | ICD-10-CM | POA: Insufficient documentation

## 2015-01-24 NOTE — Assessment & Plan Note (Signed)
Given his age and hx, would treat.  Start doxy and f/u prn.  Rest and fluids o/w.  He agrees.  Still okay for outpatient fu.  Has family help at home.

## 2015-02-05 ENCOUNTER — Ambulatory Visit (INDEPENDENT_AMBULATORY_CARE_PROVIDER_SITE_OTHER): Payer: Medicare Other | Admitting: *Deleted

## 2015-02-05 DIAGNOSIS — E538 Deficiency of other specified B group vitamins: Secondary | ICD-10-CM | POA: Diagnosis not present

## 2015-02-05 MED ORDER — CYANOCOBALAMIN 1000 MCG/ML IJ SOLN
1000.0000 ug | Freq: Once | INTRAMUSCULAR | Status: AC
  Administered 2015-02-05: 1000 ug via INTRAMUSCULAR

## 2015-02-22 ENCOUNTER — Encounter: Payer: Self-pay | Admitting: Family Medicine

## 2015-02-22 ENCOUNTER — Ambulatory Visit (INDEPENDENT_AMBULATORY_CARE_PROVIDER_SITE_OTHER): Payer: Medicare Other | Admitting: Family Medicine

## 2015-02-22 ENCOUNTER — Telehealth: Payer: Self-pay | Admitting: Family Medicine

## 2015-02-22 VITALS — BP 110/52 | HR 67 | Temp 97.9°F | Wt 150.0 lb

## 2015-02-22 DIAGNOSIS — R05 Cough: Secondary | ICD-10-CM | POA: Diagnosis not present

## 2015-02-22 DIAGNOSIS — Z636 Dependent relative needing care at home: Secondary | ICD-10-CM | POA: Diagnosis not present

## 2015-02-22 DIAGNOSIS — M25511 Pain in right shoulder: Secondary | ICD-10-CM | POA: Diagnosis not present

## 2015-02-22 DIAGNOSIS — R059 Cough, unspecified: Secondary | ICD-10-CM

## 2015-02-22 MED ORDER — SERTRALINE HCL 25 MG PO TABS: 25.0000 mg | ORAL_TABLET | Freq: Every day | ORAL | Status: DC

## 2015-02-22 NOTE — Patient Instructions (Addendum)
Your lungs sound good.  Try taking 1-2 regular tylenol (325mg  per tab) up to 3 times a day as needed for shoulder pain.  That may help.   Take care.  Update me as needed.

## 2015-02-22 NOTE — Progress Notes (Signed)
Pre visit review using our clinic review tool, if applicable. No additional management support is needed unless otherwise documented below in the visit note.  To recap: Family has moved in.  Wife is back on hospice care, at home, still bedbound unless she is moved to a wheelchair.   He has been caring for his wife for 10+ years, she has been bedbound for 4 years.   He is still cutting the grass (a few acres) and "I feel better after I do it, better than before I start."    Cough- h/o trach at early age.  "It's (cough) been going on since then."  He doesn't complain of a new cough, but does have some sinus drainage.    Shoulder (right) pain.  "I pulled it back in 2000.  I've pulled it a time or two again in the meantime."  No pain now.  Has been having to lift his wife over the years. He isn't interested in invasive w/u or treatment.    He has some trouble sleeping but has to get up to check on his wife.  He has trained himself to wake up over the years.    Prev phone note reviewed.  His mood is fair, with recent upheaval noted.  He still follows [employer] risk [employer] (he actively talks about recent primary election events).    Meds, vitals, and allergies reviewed.   ROS: See HPI.  Otherwise, noncontributory.  nad ncat Alert and oriented Mmm Neck supple Pain with ROM R shoulder, + pain with int rotation and impingement.  rrr ctab Ext w/o edema Senile bruising noted on the arms B

## 2015-02-22 NOTE — Telephone Encounter (Signed)
Patient has an appointment with Dr.Duncan today at 12:15.  Patient's daughter in law,Lisa,called.  She said patient is having problems with his memory.  He forgot how to start the three wheeler and he couldn't remember how to open the mustard container.  She said he's having a problem with incontinence.  He's napping more during the day and complains he can't sleep at night.  Patient isn't helping around the house.  He use to take out the trash. He won't hang up his clothes.  Lattie Haw thinks that patient is depressed. Lattie Haw wanted to make you aware of these concerns before his appointment today.

## 2015-02-25 DIAGNOSIS — M25519 Pain in unspecified shoulder: Secondary | ICD-10-CM | POA: Insufficient documentation

## 2015-02-25 NOTE — Assessment & Plan Note (Signed)
Likely cuff sx, d/w pt about ROM and pendulum exercises.  Can try tylenol for pain first and update me as needed.

## 2015-02-25 NOTE — Assessment & Plan Note (Signed)
Based on the evidence from the exam today, he appear to be functioning at a high level for an 81 y/o male.  I offered my support and he thanked me. I appreciate hospice help for his wife. >25 minutes spent in face to face time with patient, >50% spent in counselling or coordination of care.

## 2015-02-25 NOTE — Telephone Encounter (Signed)
See OV note.  

## 2015-02-25 NOTE — Assessment & Plan Note (Signed)
This is his baseline cough and he doesn't have an acute exacerbation by his report or by exam.  Would follow for now, he'll update me as needed.  He agrees with that.

## 2015-03-25 ENCOUNTER — Telehealth: Payer: Self-pay | Admitting: Family Medicine

## 2015-03-25 NOTE — Telephone Encounter (Signed)
Ronnie Chan from [facility] left message Pt is unaware that he has irregulare heart beat  Recent bp is 124/64 Pulse 69 Pt is in  NO distress, just wanted you to be aware  Pt number is [phone]

## 2015-03-25 NOTE — Telephone Encounter (Signed)
Spoke with daughter-in-law and scheduled appt for 03/26/15 at 10:30 am.

## 2015-03-25 NOTE — Telephone Encounter (Signed)
I want him to come in for a recheck about his heart rate.  He'll need EKG at the OV.   If no CP, not SOB, then okay for outpatient f/u.  Thanks.

## 2015-03-26 ENCOUNTER — Encounter: Payer: Self-pay | Admitting: Family Medicine

## 2015-03-26 ENCOUNTER — Ambulatory Visit (INDEPENDENT_AMBULATORY_CARE_PROVIDER_SITE_OTHER): Payer: Medicare Other | Admitting: Family Medicine

## 2015-03-26 ENCOUNTER — Telehealth: Payer: Self-pay | Admitting: Family Medicine

## 2015-03-26 VITALS — BP 124/52 | HR 73 | Temp 97.5°F | Wt 146.8 lb

## 2015-03-26 DIAGNOSIS — I4891 Unspecified atrial fibrillation: Secondary | ICD-10-CM | POA: Diagnosis not present

## 2015-03-26 DIAGNOSIS — I499 Cardiac arrhythmia, unspecified: Secondary | ICD-10-CM

## 2015-03-26 LAB — CBC WITH DIFFERENTIAL/PLATELET
Basophils Absolute: 0 K/uL (ref 0.0–0.1)
Basophils Relative: 0.6 % (ref 0.0–3.0)
Eosinophils Absolute: 0.2 K/uL (ref 0.0–0.7)
Eosinophils Relative: 2.4 % (ref 0.0–5.0)
HCT: 40.5 % (ref 39.0–52.0)
Hemoglobin: 13.5 g/dL (ref 13.0–17.0)
Lymphocytes Relative: 13.6 % (ref 12.0–46.0)
Lymphs Abs: 1.1 K/uL (ref 0.7–4.0)
MCHC: 33.5 g/dL (ref 30.0–36.0)
MCV: 88.5 fl (ref 78.0–100.0)
Monocytes Absolute: 0.6 K/uL (ref 0.1–1.0)
Monocytes Relative: 7.6 % (ref 3.0–12.0)
Neutro Abs: 5.9 K/uL (ref 1.4–7.7)
Neutrophils Relative %: 75.8 % (ref 43.0–77.0)
Platelets: 234 K/uL (ref 150.0–400.0)
RBC: 4.57 Mil/uL (ref 4.22–5.81)
RDW: 14.7 % (ref 11.5–15.5)
WBC: 7.7 K/uL (ref 4.0–10.5)

## 2015-03-26 LAB — COMPREHENSIVE METABOLIC PANEL WITH GFR
ALT: 9 U/L (ref 0–53)
AST: 17 U/L (ref 0–37)
Albumin: 3.8 g/dL (ref 3.5–5.2)
Alkaline Phosphatase: 49 U/L (ref 39–117)
BUN: 27 mg/dL — ABNORMAL HIGH (ref 6–23)
CO2: 32 meq/L (ref 19–32)
Calcium: 9.8 mg/dL (ref 8.4–10.5)
Chloride: 105 meq/L (ref 96–112)
Creatinine, Ser: 1.49 mg/dL (ref 0.40–1.50)
GFR: 47.26 mL/min — ABNORMAL LOW
Glucose, Bld: 89 mg/dL (ref 70–99)
Potassium: 4.4 meq/L (ref 3.5–5.1)
Sodium: 141 meq/L (ref 135–145)
Total Bilirubin: 1 mg/dL (ref 0.2–1.2)
Total Protein: 6.7 g/dL (ref 6.0–8.3)

## 2015-03-26 LAB — TSH: TSH: 2.96 u[IU]/mL (ref 0.35–4.50)

## 2015-03-26 NOTE — Progress Notes (Signed)
Pre visit review using our clinic review tool, if applicable. No additional management support is needed unless otherwise documented below in the visit note.  Home exam by RN.  Noted ectopy.  No CP, not SOB.  No BLE edema.  At baseline level of exertion, activity.  No syncope.  No known h/o AF, etc.  No blood in stool per patient.  No black stools.  He doesn't feel irregular pulse.  No other new complaints from patient.  Still caring for his wife, and that is stressful.  She is still on hospice.   Meds, vitals, and allergies reviewed.   ROS: See HPI.  Otherwise, noncontributory.  GEN: nad, alert and oriented, hard of hearing at baseline.  HEENT: mucous membranes moist NECK: supple w/o LA CV: IRR not tachy PULM: ctab, no inc wob ABD: soft, +bs EXT: no edema SKIN: no acute rash  EKG with likely AF, d/w pt.

## 2015-03-26 NOTE — Patient Instructions (Signed)
You likely have atrial fibrillation, where the top of your heart goes too fast and the bottom portion of your heart skips beats as it tries to keep up.  I want your to see the cardiology clinic. Rosaria Ferries will call about your referral. Go to the lab on the way out.  We'll contact you with your lab report. Make sure to do the stool kit.  We need to see if you are passing any blood before we potentially change any of your meds.  If you get chest pain, short of breath, or a really fast heart rate, then go to the ER.  I would limit exertion for now.  Take care.  We'll be in touch.

## 2015-03-26 NOTE — Telephone Encounter (Signed)
Son naaman curro called in wants to speak with dr Damita Dunnings regarding dad's eating habits and some things that have occurred recently.  He states that dad is having bloody stools as well. Call back number [phone].  Thanks

## 2015-03-27 DIAGNOSIS — I499 Cardiac arrhythmia, unspecified: Secondary | ICD-10-CM | POA: Insufficient documentation

## 2015-03-27 NOTE — Assessment & Plan Note (Signed)
Likely AF, not tachy, no change in meds.  I don't want to start BB and risk hypotension.  I didn't want to start ASA unless IFOB neg given his hx.  Check basic labs today and we'll update patient.  Refer to cards in the meantime.  No emergent sx.  If worsening- to ER.  He agrees. >25 minutes spent in face to face time with patient, >50% spent in counselling or coordination of care.

## 2015-03-28 NOTE — Telephone Encounter (Signed)
I had called his son and d/w him (at the patient's request) after the OV.  D/w his son re: a fib, recent labs wnl, hold on anticoagulation at least until IFOB done, no new meds at this point since rate was okay, and cards f/u.  He agrees with all.  Thanked me for the call.

## 2015-04-01 ENCOUNTER — Other Ambulatory Visit (INDEPENDENT_AMBULATORY_CARE_PROVIDER_SITE_OTHER): Payer: Medicare Other

## 2015-04-01 DIAGNOSIS — I4891 Unspecified atrial fibrillation: Secondary | ICD-10-CM

## 2015-04-01 LAB — FECAL OCCULT BLOOD, IMMUNOCHEMICAL: FECAL OCCULT BLD: NEGATIVE

## 2015-04-02 ENCOUNTER — Ambulatory Visit (INDEPENDENT_AMBULATORY_CARE_PROVIDER_SITE_OTHER): Payer: Medicare Other | Admitting: Cardiovascular Disease

## 2015-04-02 ENCOUNTER — Encounter (INDEPENDENT_AMBULATORY_CARE_PROVIDER_SITE_OTHER): Payer: Self-pay

## 2015-04-02 ENCOUNTER — Encounter: Payer: Self-pay | Admitting: Cardiovascular Disease

## 2015-04-02 VITALS — BP 140/60 | HR 92 | Ht 69.0 in | Wt 148.8 lb

## 2015-04-02 DIAGNOSIS — I4891 Unspecified atrial fibrillation: Secondary | ICD-10-CM

## 2015-04-02 DIAGNOSIS — R0602 Shortness of breath: Secondary | ICD-10-CM

## 2015-04-02 DIAGNOSIS — R9431 Abnormal electrocardiogram [ECG] [EKG]: Secondary | ICD-10-CM

## 2015-04-02 MED ORDER — APIXABAN 5 MG PO TABS: 5.0000 mg | ORAL_TABLET | Freq: Two times a day (BID) | ORAL | Status: DC

## 2015-04-02 NOTE — Progress Notes (Signed)
Primary care physician: Dr. Damita Dunnings  HPI  This is a pleasant 81 year old male who was referred for evaluation of newly diagnosed atrial fibrillation. The patient reports previous TIA. No previous history of hypertension or congestive heart failure. He was hospitalized in 2011 at [facility] for lower GI bleed due to diverticulitis. He required 4 units of packed RBC transfusion. He had mildly elevated cardiac enzymes at that time and underwent a pharmacologic nuclear stress test which showed no evidence of ischemia. No further episodes of bleeding since then. The patient was recently noted to have irregular rhythm. An EKG confirmed atrial fibrillation. He denies any palpitations, dizziness or syncope. No chest pain. He had routine labs done recently which showed a creatinine of 1.49 which is close to baseline. The rest of the labs were overall unremarkable. Fecal occult blood was negative.  Allergies  Allergen Reactions  . Lisinopril Other (See Comments)    insomnia  . Simvastatin Other (See Comments)    myalgias  . Sulfadiazine Other (See Comments)    unknown     Current Outpatient Prescriptions on File Prior to Visit  Medication Sig Dispense Refill  . Cyanocobalamin (VITAMIN B-12) 1000 MCG/15ML LIQD One injection every other month.    . pantoprazole (PROTONIX) 40 MG tablet TAKE 1 TABLET (40 MG TOTAL) BY MOUTH DAILY. 90 tablet 1  . sertraline (ZOLOFT) 25 MG tablet Take 1 tablet (25 mg total) by mouth daily. 90 tablet 3  . tamsulosin (FLOMAX) 0.4 MG CAPS capsule TAKE 1 CAPSULE (0.4 MG TOTAL) BY MOUTH DAILY AFTER BREAKFAST. 90 capsule 1   No current facility-administered medications on file prior to visit.     Past Medical History  Diagnosis Date  . history of mini stroke 4    Dr. Earley Favor  . Diverticulitis of colon with bleeding 03/11    Hospital 03/25-03/29/11, ARF due dehydr, acute blood loss anemia due divertic bleed  . Lower GI bleed 04/11    Hospital 04/01-04/06/11, acute  blood loss anemia, transfusion 4 units PRBC's, non stemi  . History of CT scan of abdomen 11/29/09    No acute process, large R parapelvic renal cyst calcull  . History of ETT 11/24/09    myoview, nml EF 48%     Past Surgical History  Procedure Laterality Date  . Tracheostomy  7 yoa    Strep throat  . Septoplasty  1990    Dr. Ernesto Rutherford, left  . Cataract extraction, bilateral  09/1997- 10/1997     Family History  Problem Relation Age of Onset  . Heart disease Father     CHF  . Heart disease Paternal Grandfather     heart attacks     History   Social History  . Marital Status: Married    Spouse Name: N/A  . Number of Children: 2  . Years of Education: N/A   Occupational History  . Retired Education officer, [employer]    Social History Main Topics  . Smoking status: Never Smoker   . Smokeless tobacco: Never Used  . Alcohol Use: No  . Drug Use: No  . Sexual Activity: Not on file   Other Topics Concern  . Not on file   Social History Narrative   Wife on hospice as of 2015     ROS A 10 point review of system was performed. It is negative other than that mentioned in the history of present illness.   PHYSICAL EXAM   BP 140/60 mmHg  Pulse 92  Ht 5\' 9"  (1.753 m)  Wt 148 lb 12 oz (67.473 kg)  BMI 21.96 kg/m2 Constitutional: He is oriented to person, place, and time. He appears well-developed and well-nourished. No distress.  HENT: No nasal discharge.  Head: Normocephalic and atraumatic.  Eyes: Pupils are equal and round.  No discharge. Neck: Normal range of motion. Neck supple. No JVD present. No thyromegaly present.  Cardiovascular: Normal rate, irregular rhythm, normal heart sounds. Exam reveals no gallop and no friction rub. No murmur heard.  Pulmonary/Chest: Effort normal and breath sounds normal. No stridor. No respiratory distress. He has no wheezes. He has no rales. He exhibits no tenderness.  Abdominal: Soft. Bowel sounds are normal. He exhibits  no distension. There is no tenderness. There is no rebound and no guarding.  Musculoskeletal: Normal range of motion. He exhibits no edema and no tenderness.  Neurological: He is alert and oriented to person, place, and time. Coordination normal.  Skin: Skin is warm and dry. No rash noted. He is not diaphoretic. No erythema. No pallor.  Psychiatric: He has a normal mood and affect. His behavior is normal. Judgment and thought content normal.       EKG: Atrial fibrillation  Low voltage in limb leads.   -  Nonspecific T-abnormality.   ABNORMAL     ASSESSMENT AND PLAN

## 2015-04-02 NOTE — Patient Instructions (Signed)
Medication Instructions:  Your physician has recommended you make the following change in your medication:  STOP taking aspirin START taking Eliquis 5mg  twice per day   Labwork: none  Testing/Procedures: Your physician has requested that you have an echocardiogram. Echocardiography is a painless test that uses sound waves to create images of your heart. It provides your doctor with information about the size and shape of your heart and how well your heart's chambers and valves are working. This procedure takes approximately one hour. There are no restrictions for this procedure.    Follow-Up: Your physician recommends that you schedule a follow-up appointment in: three months with Dr. Fletcher Anon.    Any Other Special Instructions Will Be Listed Below (If Applicable).  Echocardiogram An echocardiogram, or echocardiography, uses sound waves (ultrasound) to produce an image of your heart. The echocardiogram is simple, painless, obtained within a short period of time, and offers valuable information to your health care provider. The images from an echocardiogram can provide information such as:  Evidence of coronary artery disease (CAD).  Heart size.  Heart muscle function.  Heart valve function.  Aneurysm detection.  Evidence of a past heart attack.  Fluid buildup around the heart.  Heart muscle thickening.  Assess heart valve function. LET [facility] CARE PROVIDER KNOW ABOUT:  Any allergies you have.  All medicines you are taking, including vitamins, herbs, eye drops, creams, and over-the-counter medicines.  Previous problems you or members of your family have had with the use of anesthetics.  Any blood disorders you have.  Previous surgeries you have had.  Medical conditions you have.  Possibility of pregnancy, if this applies. BEFORE THE PROCEDURE  No special preparation is needed. Eat and drink normally.  PROCEDURE   In order to produce an image of your heart,  gel will be applied to your chest and a wand-like tool (transducer) will be moved over your chest. The gel will help transmit the sound waves from the transducer. The sound waves will harmlessly bounce off your heart to allow the heart images to be captured in real-time motion. These images will then be recorded.  You may need an IV to receive a medicine that improves the quality of the pictures. AFTER THE PROCEDURE You may return to your normal schedule including diet, activities, and medicines, unless your health care provider tells you otherwise. Document Released: 08/07/2000 Document Revised: 12/25/2013 Document Reviewed: 04/17/2013 [facility] Patient Information 2015 [place], [place]. This information is not intended to replace advice given to you by your health care provider. Make sure you discuss any questions you have with your health care provider.

## 2015-04-03 ENCOUNTER — Telehealth: Payer: Self-pay

## 2015-04-03 NOTE — Telephone Encounter (Signed)
Spoke with Ronnie Chan regarding the prior approval for Eliquis 5 mg, which has been approved from 04/03/2015 through 04/02/2016. Notified pharmacy of prior authorization approval as well.

## 2015-04-05 DIAGNOSIS — I4891 Unspecified atrial fibrillation: Secondary | ICD-10-CM | POA: Insufficient documentation

## 2015-04-05 NOTE — Assessment & Plan Note (Addendum)
The patient has newly diagnosed atrial fibrillation. Ventricular rate is controlled without medications. CHADS VASc score is 4. He did have previous lower GI bleed few years ago but none recently. Recent labs were overall unremarkable and fecal occult blood was negative. I discussed with him the risks and benefits of long-term anticoagulation. I discussed different options for anticoagulation and decided to start Eliquis 5 mg twice daily. His renal function has to be followed closely the dose should be adjusted if creatinine goes above 1.5. I requested an echocardiogram.

## 2015-04-08 ENCOUNTER — Telehealth: Payer: Self-pay

## 2015-04-08 NOTE — Telephone Encounter (Signed)
Spoke with Eulas Post and he verified that Eliquis cards that we are using are updated and are not expired. He verified that he would go by pharmacy to make sure that staff is aware of how to use card being that pt was told the card was expired.

## 2015-04-08 NOTE — Telephone Encounter (Signed)
Spoke with pt's family member and they mentioned that pharmacy said that 30 day trail card is out of date. Pt requesting new card. Will contact drug rep to verify if our cards are out of date.

## 2015-04-08 NOTE — Telephone Encounter (Signed)
Spoke with Kayla from pharmacy and she mentioned that pt may need to activate card or need new card. She is aware that the card we had gave pt should of been accepted due to expiration being 08/23/16. Pt will be given new card to come pick up at office.

## 2015-04-08 NOTE — Telephone Encounter (Signed)
Placed new Eliquis Free 30 day card that expires 08/23/2016.

## 2015-04-08 NOTE — Telephone Encounter (Signed)
Pt son called, states the discount card that was given last week to pt, pharmacist states it is out of date. Pt is requesting another card. Please call. Pt son states he is leaving to go out of town in the morning, and requests a call back today.

## 2015-04-09 ENCOUNTER — Ambulatory Visit (INDEPENDENT_AMBULATORY_CARE_PROVIDER_SITE_OTHER): Payer: Medicare Other

## 2015-04-09 DIAGNOSIS — E538 Deficiency of other specified B group vitamins: Secondary | ICD-10-CM

## 2015-04-09 MED ORDER — CYANOCOBALAMIN 1000 MCG/ML IJ SOLN
1000.0000 ug | Freq: Once | INTRAMUSCULAR | Status: AC
  Administered 2015-04-09: 1000 ug via INTRAMUSCULAR

## 2015-04-16 ENCOUNTER — Ambulatory Visit (INDEPENDENT_AMBULATORY_CARE_PROVIDER_SITE_OTHER): Payer: Medicare Other | Admitting: Family Medicine

## 2015-04-16 ENCOUNTER — Encounter: Payer: Self-pay | Admitting: Family Medicine

## 2015-04-16 VITALS — BP 126/60 | HR 76 | Temp 97.6°F | Wt 147.5 lb

## 2015-04-16 DIAGNOSIS — R42 Dizziness and giddiness: Secondary | ICD-10-CM

## 2015-04-16 DIAGNOSIS — I4891 Unspecified atrial fibrillation: Secondary | ICD-10-CM

## 2015-04-16 LAB — BASIC METABOLIC PANEL
BUN: 24 mg/dL — AB (ref 6–23)
CHLORIDE: 105 meq/L (ref 96–112)
CO2: 27 mEq/L (ref 19–32)
Calcium: 9.6 mg/dL (ref 8.4–10.5)
Creatinine, Ser: 1.3 mg/dL (ref 0.40–1.50)
GFR: 55.31 mL/min — AB (ref >60.00)
Glucose, Bld: 80 mg/dL (ref 70–99)
POTASSIUM: 4.2 meq/L (ref 3.5–5.1)
SODIUM: 140 meq/L (ref 135–145)

## 2015-04-16 LAB — CBC WITH DIFFERENTIAL/PLATELET
Basophils Absolute: 0 10*3/uL (ref 0.0–0.1)
Basophils Relative: 0.4 % (ref 0.0–3.0)
EOS ABS: 0.2 10*3/uL (ref 0.0–0.7)
Eosinophils Relative: 2.2 % (ref 0.0–5.0)
HCT: 38.6 % — ABNORMAL LOW (ref 39.0–52.0)
HEMOGLOBIN: 12.7 g/dL — AB (ref 13.0–17.0)
LYMPHS PCT: 12.5 % (ref 12.0–46.0)
Lymphs Abs: 0.9 10*3/uL (ref 0.7–4.0)
MCHC: 33 g/dL (ref 30.0–36.0)
MCV: 89 fl (ref 78.0–100.0)
MONO ABS: 0.5 10*3/uL (ref 0.1–1.0)
Monocytes Relative: 7.2 % (ref 3.0–12.0)
NEUTROS ABS: 5.4 10*3/uL (ref 1.4–7.7)
Neutrophils Relative %: 77.7 % — ABNORMAL HIGH (ref 43.0–77.0)
PLATELETS: 239 10*3/uL (ref 150.0–400.0)
RBC: 4.33 Mil/uL (ref 4.22–5.81)
RDW: 14.2 % (ref 11.5–15.5)
WBC: 6.9 10*3/uL (ref 4.0–10.5)

## 2015-04-16 NOTE — Patient Instructions (Signed)
Go to the lab on the way out.  We'll contact you with your lab report. Don't change your meds for now.  Make sure you drink plenty of water.  Take care.  Glad to see you.

## 2015-04-16 NOTE — Progress Notes (Signed)
Pre visit review using our clinic review tool, if applicable. No additional management support is needed unless otherwise documented below in the visit note.  He didn't feel well yesterday and decided to come in today.  He felt diffusely weak, lightheaded.  No heart racing noted by patient.  He was in the heat at the time.  He has realized that he isn't as heat tolerant as previous, especially when it is humid.  He as still been able to work in the yard when it has been cooler.  No vomiting, no diarrhea.  No fevers.    He started on eliquis in the meantime.  No bleeding.  Not passing any blood.    Meds, vitals, and allergies reviewed.   ROS: See HPI.  Otherwise, noncontributory.  ncat mmm IRR, not tachy ctab abd soft Ext w/o edema

## 2015-04-17 NOTE — Assessment & Plan Note (Addendum)
Unclear if this was from AF, vs recent hot weather.  Possible side effect from eliquis, but I wouldn't stop yet.   Would check labs today, will have patient update [place].   No known blood loss.  He doesn't have chest pain ro SOB.  Still okay for outpatient f/u.  He agrees.

## 2015-04-23 ENCOUNTER — Other Ambulatory Visit: Payer: Medicare Other

## 2015-04-24 ENCOUNTER — Ambulatory Visit: Payer: Medicare Other | Admitting: Physician Assistant

## 2015-04-25 ENCOUNTER — Ambulatory Visit (INDEPENDENT_AMBULATORY_CARE_PROVIDER_SITE_OTHER): Payer: Medicare Other

## 2015-04-25 ENCOUNTER — Other Ambulatory Visit: Payer: Self-pay

## 2015-04-25 ENCOUNTER — Telehealth: Payer: Self-pay | Admitting: *Deleted

## 2015-04-25 DIAGNOSIS — I4891 Unspecified atrial fibrillation: Secondary | ICD-10-CM

## 2015-04-25 NOTE — Telephone Encounter (Signed)
Please call  Patient on this number [phone] for when echo results come in as he will be out of town.

## 2015-05-01 ENCOUNTER — Telehealth: Payer: Self-pay | Admitting: *Deleted

## 2015-05-01 NOTE — Telephone Encounter (Signed)
Please call patient with echo results

## 2015-05-02 ENCOUNTER — Telehealth: Payer: Self-pay | Admitting: Family Medicine

## 2015-05-02 NOTE — Telephone Encounter (Signed)
I'll route this over to cards re: echo report.   In meantime, he still had good ventricular function.  He did have some back flow across a valve but nothing severe.  Overall, I think it looked okay.   Thanks.

## 2015-05-02 NOTE — Telephone Encounter (Signed)
Notified patient.  Patient verbalized understanding. 

## 2015-05-02 NOTE — Telephone Encounter (Signed)
Reviewed results with pt. See result note

## 2015-05-02 NOTE — Telephone Encounter (Signed)
Ronnie Chan called to see if you received results of echo he had this done 1 st of sept  She thinks cardiology ordered She stated they are having a few more memory problem and needs advise

## 2015-05-17 ENCOUNTER — Other Ambulatory Visit: Payer: Self-pay | Admitting: Family Medicine

## 2015-05-30 ENCOUNTER — Ambulatory Visit (INDEPENDENT_AMBULATORY_CARE_PROVIDER_SITE_OTHER): Payer: Medicare Other | Admitting: Cardiovascular Disease

## 2015-05-30 ENCOUNTER — Encounter: Payer: Self-pay | Admitting: Cardiovascular Disease

## 2015-05-30 VITALS — BP 106/66 | HR 88 | Ht 71.0 in | Wt 144.5 lb

## 2015-05-30 DIAGNOSIS — I4891 Unspecified atrial fibrillation: Secondary | ICD-10-CM | POA: Diagnosis not present

## 2015-05-30 NOTE — Progress Notes (Signed)
Primary care physician: Dr. Damita Dunnings  HPI  This is a pleasant 81 year old male who is here for a follow up visit regarding chronic atrial fibrillation. The patient reports previous TIA. No previous history of hypertension or congestive heart failure. He was hospitalized in 2011 at [facility] for lower GI bleed due to diverticulitis. He required 4 units of packed RBC transfusion. He had mildly elevated cardiac enzymes at that time and underwent a pharmacologic nuclear stress test which showed no evidence of ischemia. No further episodes of bleeding since then. The patient was recently noted to have irregular rhythm. An EKG confirmed atrial fibrillation. He denies any palpitations, dizziness or syncope. No chest pain. He was started on anticoagulation with Eliquis which has been well-tolerated. He has been doing well overall.  Allergies  Allergen Reactions  . Lisinopril Other (See Comments)    insomnia  . Simvastatin Other (See Comments)    myalgias  . Sulfadiazine Other (See Comments)    unknown     Current Outpatient Prescriptions on File Prior to Visit  Medication Sig Dispense Refill  . apixaban (ELIQUIS) 5 MG TABS tablet Take 1 tablet (5 mg total) by mouth 2 (two) times daily. 60 tablet 5  . pantoprazole (PROTONIX) 40 MG tablet TAKE 1 TABLET (40 MG TOTAL) BY MOUTH DAILY. 90 tablet 1  . sertraline (ZOLOFT) 25 MG tablet Take 1 tablet (25 mg total) by mouth daily. 90 tablet 3  . tamsulosin (FLOMAX) 0.4 MG CAPS capsule TAKE 1 CAPSULE (0.4 MG TOTAL) BY MOUTH DAILY AFTER BREAKFAST. 90 capsule 1   No current facility-administered medications on file prior to visit.     Past Medical History  Diagnosis Date  . history of mini stroke 34    Dr. Earley Favor  . Diverticulitis of colon with bleeding 03/11    Hospital 03/25-03/29/11, ARF due dehydr, acute blood loss anemia due divertic bleed  . Lower GI bleed 04/11    Hospital 04/01-04/06/11, acute blood loss anemia, transfusion 4 units PRBC's,  non stemi  . History of CT scan of abdomen 11/29/09    No acute process, large R parapelvic renal cyst calcull  . History of ETT 11/24/09    myoview, nml EF 48%     Past Surgical History  Procedure Laterality Date  . Tracheostomy  7 yoa    Strep throat  . Septoplasty  1990    Dr. Ernesto Rutherford, left  . Cataract extraction, bilateral  09/1997- 10/1997     Family History  Problem Relation Age of Onset  . Heart disease Father     CHF  . Heart disease Paternal Grandfather     heart attacks     Social History   Social History  . Marital Status: Married    Spouse Name: N/A  . Number of Children: 2  . Years of Education: N/A   Occupational History  . Retired Education officer, [employer]    Social History Main Topics  . Smoking status: Never Smoker   . Smokeless tobacco: Never Used  . Alcohol Use: No  . Drug Use: No  . Sexual Activity: Not on file   Other Topics Concern  . Not on file   Social History Narrative   Wife on hospice as of 2015     ROS A 10 point review of system was performed. It is negative other than that mentioned in the history of present illness.   PHYSICAL EXAM   BP 106/66 mmHg  Pulse 88  Ht  5\' 11"  (1.803 m)  Wt 144 lb 8 oz (65.545 kg)  BMI 20.16 kg/m2 Constitutional: He is oriented to person, place, and time. He appears well-developed and well-nourished. No distress.  HENT: No nasal discharge.  Head: Normocephalic and atraumatic.  Eyes: Pupils are equal and round.  No discharge. Neck: Normal range of motion. Neck supple. No JVD present. No thyromegaly present.  Cardiovascular: Normal rate, irregular rhythm, normal heart sounds. Exam reveals no gallop and no friction rub. No murmur heard.  Pulmonary/Chest: Effort normal and breath sounds normal. No stridor. No respiratory distress. He has no wheezes. He has no rales. He exhibits no tenderness.  Abdominal: Soft. Bowel sounds are normal. He exhibits no distension. There is no tenderness.  There is no rebound and no guarding.  Musculoskeletal: Normal range of motion. He exhibits no edema and no tenderness.  Neurological: He is alert and oriented to person, place, and time. Coordination normal.  Skin: Skin is warm and dry. No rash noted. He is not diaphoretic. No erythema. No pallor.  Psychiatric: He has a normal mood and affect. His behavior is normal. Judgment and thought content normal.       EKG: Atrial fibrillation  Low voltage in limb leads.   -  Nonspecific T-abnormality.   ABNORMAL     ASSESSMENT AND PLAN

## 2015-05-30 NOTE — Patient Instructions (Signed)
Medication Instructions: Continue same medications.   Labwork: None.   Procedures/Testing: None.   Follow-Up: 6 months with Dr. Arida.   Any Additional Special Instructions Will Be Listed Below (If Applicable).   

## 2015-05-31 NOTE — Assessment & Plan Note (Signed)
The patient is doing well with rate control and seems to be asymptomatic. He is tolerating anticoagulation with no reported side effects. His creatinine is less than 1.5, thus I recommend continuing the same dose.

## 2015-06-10 ENCOUNTER — Other Ambulatory Visit: Payer: Self-pay | Admitting: Family Medicine

## 2015-06-11 ENCOUNTER — Ambulatory Visit (INDEPENDENT_AMBULATORY_CARE_PROVIDER_SITE_OTHER): Payer: Medicare Other

## 2015-06-11 DIAGNOSIS — E538 Deficiency of other specified B group vitamins: Secondary | ICD-10-CM

## 2015-06-11 MED ORDER — CYANOCOBALAMIN 1000 MCG/ML IJ SOLN
1000.0000 ug | Freq: Once | INTRAMUSCULAR | Status: AC
  Administered 2015-06-11: 1000 ug via INTRAMUSCULAR

## 2015-08-13 ENCOUNTER — Ambulatory Visit (INDEPENDENT_AMBULATORY_CARE_PROVIDER_SITE_OTHER): Payer: Medicare Other | Admitting: *Deleted

## 2015-08-13 DIAGNOSIS — E538 Deficiency of other specified B group vitamins: Secondary | ICD-10-CM

## 2015-08-13 MED ORDER — CYANOCOBALAMIN 1000 MCG/ML IJ SOLN
1000.0000 ug | Freq: Once | INTRAMUSCULAR | Status: AC
  Administered 2015-08-13: 1000 ug via INTRAMUSCULAR

## 2015-10-06 ENCOUNTER — Other Ambulatory Visit: Payer: Self-pay | Admitting: Cardiovascular Disease

## 2015-10-20 ENCOUNTER — Other Ambulatory Visit: Payer: Self-pay | Admitting: Family Medicine

## 2015-11-11 ENCOUNTER — Other Ambulatory Visit: Payer: Self-pay | Admitting: Family Medicine

## 2015-11-12 ENCOUNTER — Emergency Department (HOSPITAL_COMMUNITY): Payer: Medicare Other

## 2015-11-12 ENCOUNTER — Observation Stay (HOSPITAL_COMMUNITY)
Admission: EM | Admit: 2015-11-12 | Discharge: 2015-11-16 | Disposition: A | Discharge status: Home / Self Care | Payer: Medicare Other | Attending: Internal Medicine | Admitting: Internal Medicine

## 2015-11-12 ENCOUNTER — Encounter (HOSPITAL_COMMUNITY): Payer: Self-pay | Admitting: Emergency Medicine

## 2015-11-12 ENCOUNTER — Telehealth: Payer: Self-pay | Admitting: Family Medicine

## 2015-11-12 DIAGNOSIS — I472 Ventricular tachycardia: Secondary | ICD-10-CM | POA: Insufficient documentation

## 2015-11-12 DIAGNOSIS — K5520 Angiodysplasia of colon without hemorrhage: Secondary | ICD-10-CM | POA: Diagnosis not present

## 2015-11-12 DIAGNOSIS — I480 Paroxysmal atrial fibrillation: Secondary | ICD-10-CM | POA: Diagnosis not present

## 2015-11-12 DIAGNOSIS — Z79899 Other long term (current) drug therapy: Secondary | ICD-10-CM | POA: Insufficient documentation

## 2015-11-12 DIAGNOSIS — D509 Iron deficiency anemia, unspecified: Secondary | ICD-10-CM | POA: Insufficient documentation

## 2015-11-12 DIAGNOSIS — F329 Major depressive disorder, single episode, unspecified: Secondary | ICD-10-CM | POA: Insufficient documentation

## 2015-11-12 DIAGNOSIS — K317 Polyp of stomach and duodenum: Secondary | ICD-10-CM | POA: Insufficient documentation

## 2015-11-12 DIAGNOSIS — Z8673 Personal history of transient ischemic attack (TIA), and cerebral infarction without residual deficits: Secondary | ICD-10-CM | POA: Diagnosis not present

## 2015-11-12 DIAGNOSIS — K219 Gastro-esophageal reflux disease without esophagitis: Secondary | ICD-10-CM | POA: Diagnosis not present

## 2015-11-12 DIAGNOSIS — R05 Cough: Secondary | ICD-10-CM | POA: Diagnosis not present

## 2015-11-12 DIAGNOSIS — D120 Benign neoplasm of cecum: Secondary | ICD-10-CM | POA: Diagnosis not present

## 2015-11-12 DIAGNOSIS — I482 Chronic atrial fibrillation: Secondary | ICD-10-CM | POA: Insufficient documentation

## 2015-11-12 DIAGNOSIS — K5730 Diverticulosis of large intestine without perforation or abscess without bleeding: Secondary | ICD-10-CM | POA: Diagnosis not present

## 2015-11-12 DIAGNOSIS — Z7901 Long term (current) use of anticoagulants: Secondary | ICD-10-CM | POA: Insufficient documentation

## 2015-11-12 DIAGNOSIS — D62 Acute posthemorrhagic anemia: Secondary | ICD-10-CM | POA: Diagnosis not present

## 2015-11-12 DIAGNOSIS — R195 Other fecal abnormalities: Secondary | ICD-10-CM | POA: Insufficient documentation

## 2015-11-12 DIAGNOSIS — D649 Anemia, unspecified: Secondary | ICD-10-CM | POA: Diagnosis present

## 2015-11-12 DIAGNOSIS — E785 Hyperlipidemia, unspecified: Secondary | ICD-10-CM | POA: Diagnosis not present

## 2015-11-12 DIAGNOSIS — K922 Gastrointestinal hemorrhage, unspecified: Secondary | ICD-10-CM | POA: Insufficient documentation

## 2015-11-12 DIAGNOSIS — N183 Chronic kidney disease, stage 3 (moderate): Secondary | ICD-10-CM | POA: Diagnosis not present

## 2015-11-12 DIAGNOSIS — R079 Chest pain, unspecified: Principal | ICD-10-CM | POA: Insufficient documentation

## 2015-11-12 DIAGNOSIS — I5031 Acute diastolic (congestive) heart failure: Secondary | ICD-10-CM | POA: Insufficient documentation

## 2015-11-12 DIAGNOSIS — I4891 Unspecified atrial fibrillation: Secondary | ICD-10-CM | POA: Diagnosis present

## 2015-11-12 DIAGNOSIS — K640 First degree hemorrhoids: Secondary | ICD-10-CM | POA: Diagnosis not present

## 2015-11-12 DIAGNOSIS — N179 Acute kidney failure, unspecified: Secondary | ICD-10-CM | POA: Diagnosis not present

## 2015-11-12 HISTORY — DX: Chronic kidney disease, unspecified: N18.9

## 2015-11-12 HISTORY — DX: Unspecified atrial fibrillation: I48.91

## 2015-11-12 HISTORY — DX: Deficiency of other specified B group vitamins: E53.8

## 2015-11-12 LAB — MAGNESIUM: Magnesium: 2 mg/dL (ref 1.7–2.4)

## 2015-11-12 LAB — TROPONIN I
TROPONIN I: 0.13 ng/mL — AB (ref <0.031)
TROPONIN I: 0.13 ng/mL — AB (ref <0.031)
Troponin I: 0.11 ng/mL — ABNORMAL HIGH (ref <0.031)

## 2015-11-12 LAB — CBC
HEMATOCRIT: 31.8 % — AB (ref 39.0–52.0)
Hemoglobin: 9.4 g/dL — ABNORMAL LOW (ref 13.0–17.0)
MCH: 24 pg — ABNORMAL LOW (ref 26.0–34.0)
MCHC: 29.6 g/dL — ABNORMAL LOW (ref 30.0–36.0)
MCV: 81.1 fL (ref 78.0–100.0)
Platelets: 244 10*3/uL (ref 150–400)
RBC: 3.92 MIL/uL — ABNORMAL LOW (ref 4.22–5.81)
RDW: 15 % (ref 11.5–15.5)
WBC: 8.4 10*3/uL (ref 4.0–10.5)

## 2015-11-12 LAB — APTT: APTT: 37 s (ref 24–37)

## 2015-11-12 LAB — I-STAT TROPONIN, ED
TROPONIN I, POC: 0.06 ng/mL (ref 0.00–0.08)
Troponin i, poc: 0.05 ng/mL (ref 0.00–0.08)

## 2015-11-12 LAB — PROTIME-INR
INR: 1.67 — ABNORMAL HIGH (ref 0.00–1.49)
Prothrombin Time: 19.7 seconds — ABNORMAL HIGH (ref 11.6–15.2)

## 2015-11-12 LAB — BASIC METABOLIC PANEL
Anion gap: 10 (ref 5–15)
BUN: 31 mg/dL — AB (ref 6–20)
CHLORIDE: 106 mmol/L (ref 101–111)
CO2: 26 mmol/L (ref 22–32)
CREATININE: 1.52 mg/dL — AB (ref 0.61–1.24)
Calcium: 9.6 mg/dL (ref 8.9–10.3)
GFR calc Af Amer: 45 mL/min — ABNORMAL LOW (ref >60)
GFR calc non Af Amer: 39 mL/min — ABNORMAL LOW (ref >60)
GLUCOSE: 123 mg/dL — AB (ref 65–99)
POTASSIUM: 4.9 mmol/L (ref 3.5–5.1)
SODIUM: 142 mmol/L (ref 135–145)

## 2015-11-12 LAB — FERRITIN: Ferritin: 10 ng/mL — ABNORMAL LOW (ref 24–336)

## 2015-11-12 LAB — IRON AND TIBC
IRON: 12 ug/dL — AB (ref 45–182)
SATURATION RATIOS: 3 % — AB (ref 17.9–39.5)
TIBC: 344 ug/dL (ref 250–450)
UIBC: 332 ug/dL

## 2015-11-12 LAB — BRAIN NATRIURETIC PEPTIDE: B NATRIURETIC PEPTIDE 5: 742.7 pg/mL — AB (ref 0.0–100.0)

## 2015-11-12 LAB — VITAMIN B12: Vitamin B-12: 396 pg/mL (ref 180–914)

## 2015-11-12 LAB — POC OCCULT BLOOD, ED: Fecal Occult Bld: POSITIVE — AB

## 2015-11-12 MED ORDER — PANTOPRAZOLE SODIUM 40 MG PO TBEC
40.0000 mg | DELAYED_RELEASE_TABLET | Freq: Every day | ORAL | Status: DC
  Administered 2015-11-14 – 2015-11-16 (×3): 40 mg via ORAL
  Filled 2015-11-12 (×4): qty 1

## 2015-11-12 MED ORDER — ONDANSETRON HCL 4 MG PO TABS: 4.0000 mg | ORAL_TABLET | Freq: Four times a day (QID) | ORAL | Status: DC | PRN

## 2015-11-12 MED ORDER — ACETAMINOPHEN 650 MG RE SUPP: 650.0000 mg | Freq: Four times a day (QID) | RECTAL | Status: DC | PRN

## 2015-11-12 MED ORDER — ACETAMINOPHEN 325 MG PO TABS
650.0000 mg | ORAL_TABLET | Freq: Four times a day (QID) | ORAL | Status: DC | PRN
  Filled 2015-11-12: qty 2

## 2015-11-12 MED ORDER — TAMSULOSIN HCL 0.4 MG PO CAPS
0.4000 mg | ORAL_CAPSULE | Freq: Every day | ORAL | Status: DC
  Administered 2015-11-14 – 2015-11-16 (×3): 0.4 mg via ORAL
  Filled 2015-11-12 (×4): qty 1

## 2015-11-12 MED ORDER — ONDANSETRON HCL 4 MG/2ML IJ SOLN
4.0000 mg | Freq: Four times a day (QID) | INTRAMUSCULAR | Status: DC | PRN
  Administered 2015-11-14: 4 mg via INTRAVENOUS
  Filled 2015-11-12: qty 2

## 2015-11-12 MED ORDER — SODIUM CHLORIDE 0.9 % IV BOLUS (SEPSIS)
500.0000 mL | Freq: Once | INTRAVENOUS | Status: AC
  Administered 2015-11-12: 500 mL via INTRAVENOUS

## 2015-11-12 MED ORDER — SODIUM CHLORIDE 0.9% FLUSH
3.0000 mL | Freq: Two times a day (BID) | INTRAVENOUS | Status: DC
  Administered 2015-11-12 – 2015-11-16 (×4): 3 mL via INTRAVENOUS

## 2015-11-12 MED ORDER — SERTRALINE HCL 25 MG PO TABS
25.0000 mg | ORAL_TABLET | Freq: Every day | ORAL | Status: DC
  Administered 2015-11-12 – 2015-11-16 (×5): 25 mg via ORAL
  Filled 2015-11-12 (×6): qty 1

## 2015-11-12 MED ORDER — HYDROCODONE-ACETAMINOPHEN 5-325 MG PO TABS: 1.0000 | ORAL_TABLET | ORAL | Status: DC | PRN

## 2015-11-12 MED ORDER — ALBUTEROL SULFATE HFA 108 (90 BASE) MCG/ACT IN AERS
2.0000 | INHALATION_SPRAY | Freq: Once | RESPIRATORY_TRACT | Status: DC
  Filled 2015-11-12: qty 6.7

## 2015-11-12 MED ORDER — SIMVASTATIN 20 MG PO TABS
20.0000 mg | ORAL_TABLET | Freq: Every day | ORAL | Status: DC
  Administered 2015-11-13 – 2015-11-15 (×3): 20 mg via ORAL
  Filled 2015-11-12 (×3): qty 1

## 2015-11-12 MED ORDER — SODIUM CHLORIDE 0.9 % IV SOLN
INTRAVENOUS | Status: DC
  Administered 2015-11-12: 21:00:00 via INTRAVENOUS

## 2015-11-12 MED ORDER — SENNOSIDES-DOCUSATE SODIUM 8.6-50 MG PO TABS: 1.0000 | ORAL_TABLET | Freq: Every evening | ORAL | Status: DC | PRN

## 2015-11-12 MED ORDER — ALBUTEROL SULFATE (2.5 MG/3ML) 0.083% IN NEBU: 2.5000 mg | INHALATION_SOLUTION | Freq: Four times a day (QID) | RESPIRATORY_TRACT | Status: DC | PRN

## 2015-11-12 NOTE — ED Notes (Signed)
Admitting NP at bedside

## 2015-11-12 NOTE — Telephone Encounter (Signed)
Patient Name: Ronnie Chan DOB: [birth year] Initial Comment Caller states father in law, has a bad cough, coughing up yellow mucus, not walking well, very tired Nurse Assessment Nurse: Roosvelt Maser, RN, Barnetta Chapel Date/Time (Eastern Time): 11/12/2015 9:19:53 AM Confirm and document reason for call. If symptomatic, describe symptoms. You must click the next button to save text entered. ---caller states pt is having difficulty breathing has had a cough refusing to go to ER, cough for two weeks sputum is yellow and orange. no fever, caller states she would like me to tell him to go to the ER. Has the patient traveled out of the country within the last 30 days? ---Not Applicable Does the patient have any new or worsening symptoms? ---Yes Will a triage be completed? ---Yes Related visit to physician within the last 2 weeks? ---No Does the PT have any chronic conditions? (i.e. diabetes, asthma, etc.) ---Unknown Is this a behavioral health or substance abuse call? ---No Guidelines Guideline Title Affirmed Question Affirmed Notes Cough - Acute Productive Difficulty breathing Final Disposition User Go to ED Now Roosvelt Maser, RN, Barnetta Chapel Comments pt agreed to go to ER Referrals [facility] - ED Disagree/Comply: Comply

## 2015-11-12 NOTE — ED Provider Notes (Signed)
CSN: [id number]     Arrival date & time 11/12/15  1118 History   First MD Initiated Contact with Patient 11/12/15 1332     Chief Complaint  Patient presents with  . Chest Pain     (Consider location/radiation/quality/duration/timing/severity/associated sxs/prior Treatment) HPI   Pt with hx afib on eliquis presents with gradually worsening fatigue, dyspnea on exertion, cough productive of yellow sputum.  Pt developed central burning chest pain this morning, first told his family around 10am.  The pain has been intermittent, unclear of length of pain.  He feels it may be better with deep breathing.  Has only had CP in the past related to stress or drinking cold fluids.  Denies fever, chills, orthopnea, leg swelling, bloody sputum, any abnormal bleeding.    Past Medical History  Diagnosis Date  . history of mini stroke 76    Dr. Earley Favor  . Diverticulitis of colon with bleeding 03/11    Hospital 03/25-03/29/11, ARF due dehydr, acute blood loss anemia due divertic bleed  . Lower GI bleed 04/11    Hospital 04/01-04/06/11, acute blood loss anemia, transfusion 4 units PRBC's, non stemi  . History of CT scan of abdomen 11/29/09    No acute process, large R parapelvic renal cyst calcull  . History of ETT 11/24/09    myoview, nml EF 48%   Past Surgical History  Procedure Laterality Date  . Tracheostomy  7 yoa    Strep throat  . Septoplasty  1990    Dr. Ernesto Rutherford, left  . Cataract extraction, bilateral  09/1997- 10/1997   Family History  Problem Relation Age of Onset  . Heart disease Father     CHF  . Heart disease Paternal Grandfather     heart attacks   Social History  Substance Use Topics  . Smoking status: Never Smoker   . Smokeless tobacco: Never Used  . Alcohol Use: No    Review of Systems  All other systems reviewed and are negative.     Allergies  Lisinopril; Simvastatin; and Sulfadiazine  Home Medications   Prior to Admission medications   Medication Sig  Start Date End Date Taking? Authorizing Provider  ELIQUIS 5 MG TABS tablet TAKE 1 TABLET (5 MG TOTAL) BY MOUTH 2 (TWO) TIMES DAILY. 10/07/15   Minna Merritts, MD  pantoprazole (PROTONIX) 40 MG tablet TAKE 1 TABLET (40 MG TOTAL) BY MOUTH DAILY. 11/11/15   Tonia Ghent, MD  sertraline (ZOLOFT) 25 MG tablet Take 1 tablet (25 mg total) by mouth daily. 02/22/15   Tonia Ghent, MD  simvastatin (ZOCOR) 20 MG tablet TAKE 1 TABLET (20 MG TOTAL) BY MOUTH AT BEDTIME. 10/21/15   Tonia Ghent, MD  tamsulosin (FLOMAX) 0.4 MG CAPS capsule TAKE 1 CAPSULE (0.4 MG TOTAL) BY MOUTH DAILY AFTER BREAKFAST. 06/10/15   Tonia Ghent, MD   BP 112/50 mmHg  Pulse 92  Temp(Src) 97.8 F (36.6 C) (Oral)  Resp 20  SpO2 99% Physical Exam  Constitutional: He appears well-developed and well-nourished. No distress.  HENT:  Head: Normocephalic and atraumatic.  Neck: Neck supple.  Cardiovascular: Normal rate and regular rhythm.   Pulmonary/Chest: Effort normal and breath sounds normal. No respiratory distress. He has no wheezes. He has no rales.  Abdominal: Soft. He exhibits no distension and no mass. There is no tenderness. There is no rebound and no guarding.  Genitourinary: Rectal exam shows no external hemorrhoid and no mass.  Brown liquid stool around anus.  Brown liquid  stool on glove.    Musculoskeletal: He exhibits no edema.  Neurological: He is alert. He exhibits normal muscle tone.  Skin: He is not diaphoretic.  Nursing note and vitals reviewed.   ED Course  Procedures (including critical care time) Labs Review Labs Reviewed  BASIC METABOLIC PANEL - Abnormal; Notable for the following:    Glucose, Bld 123 (*)    BUN 31 (*)    Creatinine, Ser 1.52 (*)    GFR calc non Af Amer 39 (*)    GFR calc Af Amer 45 (*)    All other components within normal limits  CBC - Abnormal; Notable for the following:    RBC 3.92 (*)    Hemoglobin 9.4 (*)    HCT 31.8 (*)    MCH 24.0 (*)    MCHC 29.6 (*)    All  other components within normal limits  POC OCCULT BLOOD, ED - Abnormal; Notable for the following:    Fecal Occult Bld POSITIVE (*)    All other components within normal limits  BRAIN NATRIURETIC PEPTIDE  I-STAT TROPOININ, ED  Randolm Idol, ED    Imaging Review Dg Chest 2 View  11/12/2015  CLINICAL DATA:  Short of breath and fatigue. EXAM: CHEST  2 VIEW COMPARISON:  11/15/2009 FINDINGS: Normal cardiac silhouette. Lungs are hyperinflated. No effusion, infiltrate or pneumothorax. Calcification of the thoracic aorta. IMPRESSION: Hyperinflated lungs without acute findings. Electronically Signed   By: Suzy Bouchard M.D.   On: 11/12/2015 12:41   I have personally reviewed and evaluated these images and lab results as part of my medical decision-making.   EKG Interpretation None       ED ECG REPORT   Date: 11/12/2015  Rate: 97  Rhythm: atrial fibrillation  QRS Axis: normal  Intervals: normal  ST/T Wave abnormalities: nonspecific ST/T changes  Conduction Disutrbances:none  Narrative Interpretation:   Old EKG Reviewed: none available  I have personally reviewed the EKG tracing and agree with the computerized printout as noted.   1:46 PM Not currently in room.    MDM   Final diagnoses:  Chest pain, unspecified chest pain type  Anemia, unspecified anemia type    Patient with hx afib on eliquis, TIA, HLD, p/w gradually worsening dyspnea on exertion, fatigue, several months,  Has had cough x 2 weeks that is productive.  Today developed central chest pain that he has never complained of before.  Has had a drop in Hgb over the past 6 months - hemoccult positive but no gross blood.  This may have caused worsening fatigue and DOE.  Hgb currently 9.4.  Chest pain possibly related to productive cough, question bronchitis.  However, given risk factors including age, HLD, hx TIA, afib better to monitor overnight with repeat troponins.  Admitted to [facility], Tye Savoy  accepting.      Clayton Bibles, PA-C 11/12/15 1604  Julianne Rice, MD 11/13/15 [phone]

## 2015-11-12 NOTE — Progress Notes (Signed)
Patient had 5 beat run of wide QRS complex asymptomatic, K. SchorrNP was notified and made aware with orders made. Lab. Was drawn for magnesium and due troponin. With order keep fluid running the same rate for now at 50cc/hour, BNP result back was >700. Will continue to monitor patient.

## 2015-11-12 NOTE — Telephone Encounter (Signed)
I'll await the ER notes.  Thanks.  

## 2015-11-12 NOTE — Telephone Encounter (Signed)
Lisa left v/m requesting cb; I spoke with Lattie Haw and she said has spoken with [place] and pt is going to [facility] ED now for eval.

## 2015-11-12 NOTE — ED Notes (Signed)
Attempted to call report

## 2015-11-12 NOTE — H&P (Signed)
[facility] History and Physical  SILBERIO VISITACION [id number] DOB: Jul 06, 1925 DOA: 11/12/2015  Referring physician: Emergency Department PCP: Elsie Stain, MD   CHIEF COMPLAINT: cough, shortness of breath and fatigue                  HPI: Ronnie Chan is a 82 y.o. male  He was evaluated in 2011 for GI bleeding. EGD and colonoscopy done and pertinent for diverticulosis. 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  Patient complains of increasing fatigue / sob with exertion over the last several months. He has a chronic cough which has been become more pronounced and productive of yellow sputum over the last few days. Patient endorses burning in his chest but states that dates back to starting Eliquis several months ago. He gets the same pain when stressed or drinks something cold.  Daughter in law in room and disagrees. She states that patient complained of chest pain this am.   Daughter-in-law washed patient's laundry and sees blood in his underpants from time to time. Patient has not noticed any blood in his stool. No gastrointestinal complaints such as abdominal pain, nausea or vomiting. He's lost 20 pounds but per daughter-in-law that has been over 5 years.  ED COURSE:           Labs:   BUN 31, Cr 1.52, trop 0.05 hgb 9.4 down from 12.7 in August, MCV 81  EKG:    Atrial fibrillation Nonspecific ST and T wave abnormality Abnormal ECG Vent rate 97                  Medications  sodium chloride 0.9 % bolus 500 mL (not administered)  albuterol (PROVENTIL HFA;VENTOLIN HFA) 108 (90 Base) MCG/ACT inhaler 2 puff (not administered)    Review of Systems  Constitutional: Positive for malaise/fatigue.  HENT: Negative.   Eyes: Negative.   Respiratory: Positive for cough, sputum production and shortness of breath.   Cardiovascular: Positive for chest pain.  Gastrointestinal: Negative.    Genitourinary: Negative.   Musculoskeletal: Negative.   Skin: Negative.   Neurological: Negative.   Endo/Heme/Allergies: Negative.   Psychiatric/Behavioral: Negative.     Past Medical History  Diagnosis Date  . history of mini stroke 65    Dr. Earley Favor  . Diverticulitis of colon with bleeding 03/11    Hospital 03/25-03/29/11, ARF due dehydr, acute blood loss anemia due divertic bleed  . Lower GI bleed 04/11    Hospital 04/01-04/06/11, acute blood loss anemia, transfusion 4 units PRBC's, non stemi  . History of CT scan of abdomen 11/29/09    No acute process, large R parapelvic renal cyst calcull  . History of ETT 11/24/09    myoview, nml EF 48%   Past Surgical History  Procedure Laterality Date  . Tracheostomy  7 yoa    Strep throat  . Septoplasty  1990    Dr. Ernesto Rutherford, left  . Cataract extraction, bilateral  09/1997- 10/1997    SOCIAL HISTORY:  reports that he has never smoked. He has never used smokeless tobacco. He reports that he does not drink alcohol or use illicit drugs. Lives:   At home with bedbound wife and other family members.    Assistive devices:   None needed for ambulation.   Allergies  Allergen Reactions  . Lisinopril Other (See Comments)    insomnia  . Simvastatin Other (See Comments)  myalgias  . Sulfadiazine Other (See Comments)    unknown    Family History  Problem Relation Age of Onset  . Heart disease Father     CHF  . Heart disease Paternal Grandfather     heart attacks    Prior to Admission medications   Medication Sig Start Date End Date Taking? Authorizing Provider  ELIQUIS 5 MG TABS tablet TAKE 1 TABLET (5 MG TOTAL) BY MOUTH 2 (TWO) TIMES DAILY. 10/07/15   Minna Merritts, MD  pantoprazole (PROTONIX) 40 MG tablet TAKE 1 TABLET (40 MG TOTAL) BY MOUTH DAILY. 11/11/15   Tonia Ghent, MD  sertraline (ZOLOFT) 25 MG tablet Take 1 tablet (25 mg total) by mouth daily. 02/22/15   Tonia Ghent, MD  simvastatin (ZOCOR) 20 MG tablet TAKE  1 TABLET (20 MG TOTAL) BY MOUTH AT BEDTIME. 10/21/15   Tonia Ghent, MD  tamsulosin (FLOMAX) 0.4 MG CAPS capsule TAKE 1 CAPSULE (0.4 MG TOTAL) BY MOUTH DAILY AFTER BREAKFAST. 06/10/15   Tonia Ghent, MD   PHYSICAL EXAM: Filed Vitals:   11/12/15 1418 11/12/15 1430 11/12/15 1445 11/12/15 1449  BP:  109/58 116/62 116/62  Pulse: 94 61 86 89  Temp:      TempSrc:      Resp: 18 14 19 19   SpO2: 98% 100% 100% 98%    Wt Readings from Last 3 Encounters:  05/30/15 65.545 kg (144 lb 8 oz)  04/16/15 66.906 kg (147 lb 8 oz)  04/02/15 67.473 kg (148 lb 12 oz)    General:  Pleasant  White male. Appears calm and comfortable Eyes: PER, normal lids, irises & conjunctiva ENT: grossly normal hearing, lips & tongue Neck: no LAD, no masses Cardiovascular: RRR, no murmurs. No LE edema.  Respiratory: Respirations even and unlabored. Normal respiratory effort. Lungs CTA bilaterally, no wheezes / rales .   Abdomen: soft, non-distended, non-tender, active bowel sounds. No obvious masses.  Skin: no rash seen on limited exam Musculoskeletal: grossly normal tone BUE/BLE Psychiatric: grossly normal mood and affect, speech fluent and appropriate Neurologic: grossly non-focal.         LABS ON ADMISSION:    Basic Metabolic Panel:  Recent Labs Lab 11/12/15 1128  NA 142  K 4.9  CL 106  CO2 26  GLUCOSE 123*  BUN 31*  CREATININE 1.52*  CALCIUM 9.6    CBC:  Recent Labs Lab 11/12/15 1128  WBC 8.4  HGB 9.4*  HCT 31.8*  MCV 81.1  PLT 244    Creatinine clearance cannot be calculated (Unknown ideal weight.)  Radiological Exams on Admission: Dg Chest 2 View  11/12/2015  CLINICAL DATA:  Short of breath and fatigue. EXAM: CHEST  2 VIEW COMPARISON:  11/15/2009 FINDINGS: Normal cardiac silhouette. Lungs are hyperinflated. No effusion, infiltrate or pneumothorax. Calcification of the thoracic aorta. IMPRESSION: Hyperinflated lungs without acute findings. Electronically Signed   By: Suzy Bouchard  M.D.   On: 11/12/2015 12:41   Echo Sept 2016 LV EF: 55% - 60%  ------------------------------------------------------------------- History: PMH: Atrial fibrillation. Transient ischemic attack. Risk factors: Dyslipidemia.  ------------------------------------------------------------------- Study Conclusions  - Left ventricle: The cavity size was normal. There was mild  concentric hypertrophy. Systolic function was normal. The  estimated ejection fraction was in the range of 55% to 60%. Wall  motion was normal; there were no regional wall motion  abnormalities. - Aortic valve: There was mild regurgitation. - Mitral valve: There was moderate regurgitation. - Left atrium: The atrium was moderately  to severely dilated. - Right atrium: The atrium was mildly dilated. - Pulmonary arteries: Systolic pressure was mildly increased. PA  peak pressure: 46 mm Hg (S).  ASSESSMENT / PLAN   Chest pain. Heart score 3. Vague description of pain. Daughter-in-law states patient complained of chest pain this morning but patient states he's had chest pain since starting Eliquis several months ago and has the same pain when drinking cold drink or when stressed. First troponin normal. EKG non-acute -admit to Observation - telemetry.  -Chest pain order set utilized : cycle troponins, am EKG -gentle IV hydration for now.  -Pain could be pulmonary in nature given productive cough, see below -Anemia could be contributing to chest pain, see below   Acute on chronic cough, now productive of yellow sputum. No acute findings on chest x-ray. Hyperinflated lungs on CXR, no known history of COPD. Will hold off on starting antibiotics for now. Doubt pna.   Anemia, normocytic (borderline microcytic). Hgb around 12 in August, currently 9.4. Heme positive brown stools in ED. Suspect low grade GI bleeding in setting of Eliquis. 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 Not clear if he was having  overt bleeding at that time.  -Hold Eliquis -history of b12 def. Will check b12, folate , iron studies  -Will need Gastroenterology consult in am   Atrial fibrillation, rate controlled. -hold Eliquis  -monitor on telemetry  Acute kidney injury superimposed on CKD (stage 3a based on prior labs). Baseline Cr 1.3-1.4, up to 1.52 today. -gentle IV hydration -repeat am bmet  Hyperlipidemia.  -continue home statin  GERD.  -continue home PPI  Depression. Stable -continue zoloft   CONSULTANTS:   Will need Gastroenterology consult in am   Code Status: full code DVT Prophylaxis: on Eliquis  Family Communication:  Patient alert, oriented and understands plan of care.  Disposition Plan: Discharge to home in 24-48 hours   Time spent: 60 minutes Tye Savoy  NP [facility] Pager [phone]

## 2015-11-12 NOTE — ED Notes (Signed)
Pt reports that he has been increasingly fatigued with associated CP and SOB over the last month. Pt reports hx of heart arhythmia  Which he takes blood thinners for. Pt alert x4. NAD at this time.

## 2015-11-13 ENCOUNTER — Encounter (HOSPITAL_COMMUNITY): Payer: Self-pay | Admitting: Physician Assistant

## 2015-11-13 DIAGNOSIS — D509 Iron deficiency anemia, unspecified: Secondary | ICD-10-CM | POA: Diagnosis not present

## 2015-11-13 DIAGNOSIS — K922 Gastrointestinal hemorrhage, unspecified: Secondary | ICD-10-CM | POA: Diagnosis not present

## 2015-11-13 DIAGNOSIS — Z7901 Long term (current) use of anticoagulants: Secondary | ICD-10-CM | POA: Diagnosis not present

## 2015-11-13 DIAGNOSIS — R195 Other fecal abnormalities: Secondary | ICD-10-CM | POA: Diagnosis not present

## 2015-11-13 DIAGNOSIS — R079 Chest pain, unspecified: Secondary | ICD-10-CM | POA: Insufficient documentation

## 2015-11-13 DIAGNOSIS — D120 Benign neoplasm of cecum: Secondary | ICD-10-CM | POA: Diagnosis not present

## 2015-11-13 DIAGNOSIS — D649 Anemia, unspecified: Secondary | ICD-10-CM | POA: Insufficient documentation

## 2015-11-13 DIAGNOSIS — D62 Acute posthemorrhagic anemia: Secondary | ICD-10-CM | POA: Diagnosis not present

## 2015-11-13 DIAGNOSIS — K317 Polyp of stomach and duodenum: Secondary | ICD-10-CM | POA: Diagnosis not present

## 2015-11-13 DIAGNOSIS — I481 Persistent atrial fibrillation: Secondary | ICD-10-CM | POA: Diagnosis not present

## 2015-11-13 LAB — FOLATE RBC
FOLATE, HEMOLYSATE: 383.2 ng/mL
Folate, RBC: 1345 ng/mL (ref >498)
Hematocrit: 28.5 % — ABNORMAL LOW (ref 37.5–51.0)

## 2015-11-13 LAB — BRAIN NATRIURETIC PEPTIDE: B Natriuretic Peptide: 674.8 pg/mL — ABNORMAL HIGH (ref 0.0–100.0)

## 2015-11-13 MED ORDER — SODIUM CHLORIDE 0.9 % IV SOLN
25.0000 mg | Freq: Once | INTRAVENOUS | Status: AC
  Administered 2015-11-13: 25 mg via INTRAVENOUS
  Filled 2015-11-13: qty 0.5

## 2015-11-13 MED ORDER — METOPROLOL TARTRATE 25 MG PO TABS
25.0000 mg | ORAL_TABLET | Freq: Two times a day (BID) | ORAL | Status: DC
  Administered 2015-11-13 – 2015-11-16 (×7): 25 mg via ORAL
  Filled 2015-11-13 (×7): qty 1

## 2015-11-13 MED ORDER — SODIUM CHLORIDE 0.9 % IV SOLN
1500.0000 mg | Freq: Once | INTRAVENOUS | Status: AC
  Administered 2015-11-13: 1500 mg via INTRAVENOUS
  Filled 2015-11-13 (×2): qty 30

## 2015-11-13 MED ORDER — AMIODARONE HCL 200 MG PO TABS
400.0000 mg | ORAL_TABLET | Freq: Two times a day (BID) | ORAL | Status: DC
  Administered 2015-11-13: 400 mg via ORAL
  Filled 2015-11-13: qty 2

## 2015-11-13 NOTE — Progress Notes (Signed)
PROGRESS NOTE  Ronnie Chan [id number] DOB: 03/21/1925 DOA: 11/12/2015 PCP: Elsie Stain, MD Outpatient Specialists:      Brief Narrative: 82 y.o. male with a Past Medical History of TIA, diverticulitis, GI bleed who presents with CP, chronic cough, worsening anemia. Found to have positive FOBT and slight troponin elevation.  Assessment & Plan: Active Problems:   Atrial fibrillation (HCC)   Anemia   Absolute anemia   Pain in the chest   Bleeding gastrointestinal   Chest pain - typical and atypical components - slight troponin elevation likely due to demand ischemia from anemia - cardiology consulted, appreciate input  Acute blood loss anemia with underlying GI bleeding - positive fecal occult, patient denies any bleeding but the son who is bedside states that occasionally family would notice blood on patient's underpants occasionally - consulted GI, appreciate input - iron deficient, IV iron today - hold Eliquis  A fib - on amiodarone per cardiology, started today - hold Eliquis for now given #2  Chronic cough - CXR clear, perhaps chronic bronchitis  AKI on CKD stage 3 - renal function stable, continue to monitor  Hyperlipidemia.  - continue home statin  GERD.  - continue home PPI  Depression. Stable - continue zoloft  DVT prophylaxis: SCD Code Status: Full code Family Communication: d/w son bedside Disposition Plan: home when ready Barriers for discharge: GI and cards evaluation  Consultants:   GI  Cardiology  Procedures:   none  Antimicrobials:  none   Subjective: - no complaints, denies dyspnea, no chest pain today   Objective: Filed Vitals:   11/12/15 1945 11/12/15 2012 11/13/15 0200 11/13/15 0514  BP:  111/55 115/61 108/65  Pulse: 68 67  95  Temp:  98.2 F (36.8 C)  98.7 F (37.1 C)  TempSrc:  Oral  Oral  Resp:  20  20  SpO2:  100%  95%    Intake/Output Summary (Last 24 hours) at 11/13/15 1246 Last data filed at  11/13/15 0600  Gross per 24 hour  Intake  702.5 ml  Output    525 ml  Net  177.5 ml   There were no vitals filed for this visit.  Examination: BP 108/65 mmHg  Pulse 95  Temp(Src) 98.7 F (37.1 C) (Oral)  Resp 20  SpO2 95%  GENERAL: NAD  HEENT: head NCAT, no scleral icterus. Pupils round and reactive.   NECK: Supple. No carotid bruits. No lymphadenopathy or thyromegaly.  LUNGS: Clear to auscultation. No wheezing or crackles  HEART: Irregular. 2+ pulses, no JVD, no peripheral edema  ABDOMEN: Soft, nontender, and nondistended. Positive bowel sounds.   EXTREMITIES: Without any cyanosis or clubbing  NEUROLOGIC: Alert and oriented x3. Cranial nerves II through XII are grossly intact. Strength 5/5 in all 4.   Data Reviewed: I have personally reviewed following labs and imaging studies  CBC:  Recent Labs Lab 11/12/15 1128  WBC 8.4  HGB 9.4*  HCT 31.8*  MCV 81.1  PLT [id number]   Basic Metabolic Panel:  Recent Labs Lab 11/12/15 1128 11/12/15 2220  NA 142  --   K 4.9  --   CL 106  --   CO2 26  --   GLUCOSE 123*  --   BUN 31*  --   CREATININE 1.52*  --   CALCIUM 9.6  --   MG  --  2.0   GFR: CrCl cannot be calculated (Unknown ideal weight.). Liver Function Tests: No results for input(s): AST, ALT, ALKPHOS, BILITOT, PROT,  ALBUMIN in the last 168 hours. No results for input(s): LIPASE, AMYLASE in the last 168 hours. No results for input(s): AMMONIA in the last 168 hours. Coagulation Profile:  Recent Labs Lab 11/12/15 2220  INR 1.67*   Cardiac Enzymes:  Recent Labs Lab 11/12/15 1646 11/12/15 1905 11/12/15 2220  TROPONINI 0.11* 0.13* 0.13*   BNP (last 3 results) No results for input(s): PROBNP in the last 8760 hours. HbA1C: No results for input(s): HGBA1C in the last 72 hours. CBG: No results for input(s): GLUCAP in the last 168 hours. Lipid Profile: No results for input(s): CHOL, HDL, LDLCALC, TRIG, CHOLHDL, LDLDIRECT in the last 72 hours. Thyroid  Function Tests: No results for input(s): TSH, T4TOTAL, FREET4, T3FREE, THYROIDAB in the last 72 hours. Anemia Panel:  Recent Labs  11/12/15 1905  VITAMINB12 396  FERRITIN 10*  TIBC 344  IRON 12*   Urine analysis:    Component Value Date/Time   COLORURINE YELLOW 04/18/2014 [place] 04/18/2014 1155   LABSPEC 1.016 04/18/2014 1155   PHURINE 7.0 04/18/2014 1155   GLUCOSEU NEGATIVE 04/18/2014 1155   HGBUR SMALL* 04/18/2014 [place] 04/18/2014 1155   KETONESUR NEGATIVE 04/18/2014 1155   PROTEINUR NEGATIVE 04/18/2014 1155   UROBILINOGEN 1.0 04/18/2014 1155   NITRITE NEGATIVE 04/18/2014 1155   LEUKOCYTESUR TRACE* 04/18/2014 1155   Sepsis Labs: Invalid input(s): PROCALCITONIN, LACTICIDVEN  No results found for this or any previous visit (from the past 240 hour(s)).    Radiology Studies: Dg Chest 2 View  11/12/2015  CLINICAL DATA:  Short of breath and fatigue. EXAM: CHEST  2 VIEW COMPARISON:  11/15/2009 FINDINGS: Normal cardiac silhouette. Lungs are hyperinflated. No effusion, infiltrate or pneumothorax. Calcification of the thoracic aorta. IMPRESSION: Hyperinflated lungs without acute findings. Electronically Signed   By: Suzy Bouchard M.D.   On: 11/12/2015 12:41     Scheduled Meds: . amiodarone  400 mg Oral BID  . iron dextran (INFED/DEXFERRUM) infusion  1,500 mg Intravenous Once  . pantoprazole  40 mg Oral Daily  . sertraline  25 mg Oral Daily  . simvastatin  20 mg Oral q1800  . sodium chloride flush  3 mL Intravenous Q12H  . tamsulosin  0.4 mg Oral QPC breakfast   Continuous Infusions:    Marzetta Board, MD, PhD [facility] Pager [phone]  If 7PM-7AM, please contact night-coverage [email or web address] Password [facility] 11/13/2015, 12:46 PM

## 2015-11-13 NOTE — Consult Note (Signed)
CARDIOLOGY CONSULT NOTE   Patient ID: OLAYINKA KUNZMAN MRN: [id number] DOB/AGE: [birth year] 82 y.o.  Admit date: 11/12/2015  Primary Physician   Elsie Stain, MD Primary Cardiologist   Dr. Fletcher [facility]) Reason for Consultation   Chest pain and + tropo Requesting Physician  Dr. Cruzita Lederer  HPI: DICKSON BARFIELD is a 82 y.o. male with a history of chronic Afib on eliquis since 03/2015, GIB 2011, TIA 1983, CKD stage 3, chronic cough and diverticulitis who presented with increasing fatigue and sob.   No previous history of hypertension or congestive heart failure. He was hospitalized in 2011 at [facility] for lower GI bleed due to diverticulitis. He required 4 units of packed RBC transfusion. He had mildly elevated cardiac enzymes at that time and underwent a pharmacologic nuclear stress test which showed no evidence of ischemia. No further episodes of bleeding since then.  She was placed on elquis for atrial fibrillation August 2016. Since this patient states that he has being having burning sensation in her chest and gradual worsening of shortness of breath with fatigue. Patient states that he had a 1 episode of chest pain while walking about a week ago. Fats like burning sensation and resolved with rest in about 30 minutes. 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 Patient denies sore eczema with edema, orthopnea, PND, syncope, blood in his stool or urine, melena, nausea or vomiting. Intermittent episode of palpitation. Patient also has a chronic cough and intermittent swallowing issue due to Tracheostomy when he was 40-year-old.   Workup in the ED revealed positive stool guaiac. EKG shows A. fib with nonspecific ST and T-wave abnormality. Which appears similar to prior EKG. Troponin 0.11->0.13-->0.13. BNP 742. Hemoglobin of 9.4, was 12.31 March 2015. Creatinine of 1.52. Chest x-ray showed hyperinflated lungs without acute changes.  Currently chest pain-free. Telemetry shows rate in mostly in 90s to 100. However, intermittently goes to 130s. 5 beats of nonsustained VT overnight.  Echocardiogram 04/2015 shows left ventricular function of 55-60%, mild concentric hypertrophy, no WM abnormality, mild aortic regurgitation, moderate mitral regurgitation, moderate to severely dilated left atrium, mild dilated right atrium, PA peak pressure of 46 mm Hg.   Past Medical History  Diagnosis Date  . history of mini stroke 41    Dr. Earley Favor  . Diverticulitis of colon with bleeding 03/11    Hospital 03/25-03/29/11, ARF due dehydr, acute blood loss anemia due divertic bleed  . Lower GI bleed 04/11     acute blood loss anemia, transfusion 4 units PRBC's, non stemi  . History of CT scan of abdomen 11/29/09    No acute process, large R parapelvic renal cyst calcull  . History of ETT 11/24/09    myoview, nml EF 48%  . B12 deficiency   . CKD (chronic kidney disease)     stage 3 in 03/2015  . Atrial fibrillation ([place]) 2011     Past Surgical History  Procedure Laterality Date  . Tracheostomy  7 yoa    Strep throat  . Septoplasty  1990    Dr. Ernesto Rutherford, left  . Cataract extraction, bilateral  09/1997- 10/1997    Allergies  Allergen Reactions  . Lisinopril Other (See Comments)    insomnia  . Simvastatin Other (See Comments)    myalgias  . Sulfadiazine Other (See Comments)    unknown    I have reviewed the patient's current medications . pantoprazole  40 mg Oral  Daily  . sertraline  25 mg Oral Daily  . simvastatin  20 mg Oral q1800  . sodium chloride flush  3 mL Intravenous Q12H  . tamsulosin  0.4 mg Oral QPC breakfast   . sodium chloride 50 mL/hr at 11/12/15 2045   acetaminophen **OR** acetaminophen, albuterol, HYDROcodone-acetaminophen, ondansetron **OR** ondansetron (ZOFRAN) IV, senna-docusate  Prior to Admission medications   Medication Sig Start Date End Date Taking? Authorizing Provider  ELIQUIS 5 MG TABS tablet  TAKE 1 TABLET (5 MG TOTAL) BY MOUTH 2 (TWO) TIMES DAILY. 10/07/15  Yes Minna Merritts, MD  pantoprazole (PROTONIX) 40 MG tablet TAKE 1 TABLET (40 MG TOTAL) BY MOUTH DAILY. 11/11/15  Yes Tonia Ghent, MD  sertraline (ZOLOFT) 25 MG tablet Take 1 tablet (25 mg total) by mouth daily. 02/22/15  Yes Tonia Ghent, MD  simvastatin (ZOCOR) 20 MG tablet TAKE 1 TABLET (20 MG TOTAL) BY MOUTH AT BEDTIME. 10/21/15  Yes Tonia Ghent, MD  tamsulosin (FLOMAX) 0.4 MG CAPS capsule TAKE 1 CAPSULE (0.4 MG TOTAL) BY MOUTH DAILY AFTER BREAKFAST. 06/10/15  Yes Tonia Ghent, MD     Social History   Social History  . Marital Status: Married    Spouse Name: N/A  . Number of Children: 2  . Years of Education: N/A   Occupational History  . Retired Education officer, [employer]    Social History Main Topics  . Smoking status: Never Smoker   . Smokeless tobacco: Never Used  . Alcohol Use: No  . Drug Use: No  . Sexual Activity: Not on file   Other Topics Concern  . Not on file   Social History Narrative   Wife on hospice as of 19    Family Status  Relation Status Death Age  . Father Deceased 66    CHF  . Paternal Grandfather Deceased 64    heart attacks  . Mother Deceased 28    in a nursing home and died of natural causes   Family History  Problem Relation Age of Onset  . Heart disease Father     CHF  . Heart disease Paternal Grandfather     heart attacks      ROS:  Full 14 point review of systems complete and found to be negative unless listed above.  Physical Exam: Blood pressure 108/65, pulse 95, temperature 98.7 F (37.1 C), temperature source Oral, resp. rate 20, SpO2 95 %.  General: Well developed, well nourished, male in no acute distress Head: Eyes PERRLA, No xanthomas. Normocephalic and atraumatic, oropharynx without edema or exudate.  Lungs: Resp regular and unlabored, CTA. Heart: RRR no s3, s4, or murmurs..   Neck: No carotid bruits. No lymphadenopathy. + JVD vs  carotid pulsations.  Abdomen: Bowel sounds present, abdomen soft and non-tender without masses or hernias noted. Msk:  No spine or cva tenderness. No weakness, no joint deformities or effusions. Extremities: No clubbing, cyanosis or edema. DP/PT/Radials 2+ and equal bilaterally. Neuro: Alert and oriented X 3. No focal deficits noted. Psych:  Good affect, responds appropriately Skin: No rashes or lesions noted.  Labs:   Lab Results  Component Value Date   WBC 8.4 11/12/2015   HGB 9.4* 11/12/2015   HCT 31.8* 11/12/2015   MCV 81.1 11/12/2015   PLT 244 11/12/2015    Recent Labs  11/12/15 2220  INR 1.67*    Recent Labs Lab 11/12/15 1128  NA 142  K 4.9  CL 106  CO2 26  BUN 31*  CREATININE 1.52*  CALCIUM 9.6  GLUCOSE 123*   MAGNESIUM  Date Value Ref Range Status  11/12/2015 2.0 1.7 - 2.4 mg/dL Final    Recent Labs  11/12/15 1646 11/12/15 1905 11/12/15 2220  TROPONINI 0.11* 0.13* 0.13*    Recent Labs  11/12/15 1202 11/12/15 1713  TROPIPOC 0.05 0.06   No results found for: PROBNP Lab Results  Component Value Date   CHOL 212* 10/03/2013   HDL 41.40 10/03/2013   LDLCALC 79 05/17/2012   TRIG 93.0 10/03/2013   No results found for: DDIMER LIPASE  Date/Time Value Ref Range Status  04/18/2014 10:45 AM 37 11 - 59 U/L Final   TSH  Date/Time Value Ref Range Status  03/26/2015 11:10 AM 2.96 0.35 - 4.50 uIU/mL Final   VITAMIN B-12  Date/Time Value Ref Range Status  11/12/2015 07:05 PM 396 180 - 914 pg/mL Final    Comment:    (NOTE) This assay is not validated for testing neonatal or myeloproliferative syndrome specimens for Vitamin B12 levels.    FOLATE  Date/Time Value Ref Range Status  12/10/2009 12:48 PM 10.2 ng/mL Final    Comment:    See lab report for associated comment(s)   FERRITIN  Date/Time Value Ref Range Status  11/12/2015 07:05 PM 10* 24 - 336 ng/mL Final   TIBC  Date/Time Value Ref Range Status  11/12/2015 07:05 PM 344 250 - 450  ug/dL Final   IRON  Date/Time Value Ref Range Status  11/12/2015 07:05 PM 12* 45 - 182 ug/dL Final    Echo: 04/2015 LV EF: 55% - 60%  ------------------------------------------------------------------- History: PMH: Atrial fibrillation. Transient ischemic attack. Risk factors: Dyslipidemia.  ------------------------------------------------------------------- Study Conclusions  - Left ventricle: The cavity size was normal. There was mild  concentric hypertrophy. Systolic function was normal. The  estimated ejection fraction was in the range of 55% to 60%. Wall  motion was normal; there were no regional wall motion  abnormalities. - Aortic valve: There was mild regurgitation. - Mitral valve: There was moderate regurgitation. - Left atrium: The atrium was moderately to severely dilated. - Right atrium: The atrium was mildly dilated. - Pulmonary arteries: Systolic pressure was mildly increased. PA  peak pressure: 46 mm Hg (S).  ECG:  Vent. rate 111 BPM PR interval * ms QRS duration 84 ms QT/QTc 350/476 ms P-R-T axes * -46 74  Radiology:  Dg Chest 2 View  11/12/2015  CLINICAL DATA:  Short of breath and fatigue. EXAM: CHEST  2 VIEW COMPARISON:  11/15/2009 FINDINGS: Normal cardiac silhouette. Lungs are hyperinflated. No effusion, infiltrate or pneumothorax. Calcification of the thoracic aorta. IMPRESSION: Hyperinflated lungs without acute findings. Electronically Signed   By: Suzy Bouchard M.D.   On: 11/12/2015 12:41    ASSESSMENT AND PLAN:      1. Chest pain with minimally elevated troponin with flat trend - EKG shows A. fib with nonspecific ST and T-wave abnormality. Which appears similar to prior EKG. Troponin 0.11->0.13-->0.13. BNP is also elevated.  Pt symptoms are typical and atypical    For now follow  Control HR  GI to evaluate anemia  wth EGD. Given age would not jump to further ischemic eval    He also has a chronic burning sensation of his chest  since starting Eliquis. - Myoview 11/2009 shows no evidence of ischemia.  2. Chronic Afib - CHADSVASCs score of at least 4 (age, TIA) ? CHF. Eliquis is on hold due to + stool guiac. Will need  GI clearance prior to restart.  - Rate could be improved  Average around 100   WOuld try lopressor for now  He had not been on anything as an outpt  .  - Echocardiogram 04/2015 shows left ventricular function of 55-60%, mild concentric hypertrophy, no WM abnormality, mild aortic regurgitation, moderate mitral regurgitation, moderate to severely dilated left atrium, mild dilated right atrium, PA peak pressure of 46 mm Hg.   3. 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  - GI to see pt today  Plan for EGD and colon  4. Acute diastolic CHF  Volume is up sl  May be related to afib and anemia Follow  No Rx given that pt is preppng for colonoscopy  5Chronic cough - now productive. CXR hyperinflated lungs without acute abnormality. ? COPD. No wheezing or rules on exam.   6. Acute on chronic kidney disease, stage 3 - Baseline Cr 1.3-1.4, up to 1.52.   6. NSVT - 5 beats x 1 overnight. Magnesium normal. K was normal yesterday. Will get BEMT today.  Low dose lopressor   Signed: [place], PA 11/13/2015, 10:38 AM Pager [phone]   Pt seen and examined  I agree with findingas as noted by B Bhagat above  I have amended this note. On exam  HR on average around 100  Afib  Lungs mild rhonchi  Cardiac IRreg irreg  Ext no signif edema  I am not convinced of any active ischemia I would try to increase HR control  For now with low dose lopressor Eliquis on hold until GI work up complete.  Will follow.   Dorris Carnes

## 2015-11-13 NOTE — Progress Notes (Signed)
Pharmacy note: IV iron for   81 yo male here with CP, anemia, AKI with IDA and pharmacy has been consulted to dose IV iron.  -Hg= 9.4 -Iron= 12, % sat= 3  Goal of therapy -Hg ~ 14  Plan -Iron dextran test dose 25mg  x1 -If test dose tolerate then 1500mg  IV x1 -If no tolerance problems with test dose and maintenance dose will sign off  Thank you for asking pharmacy to be involved in the care of this patient.  Hildred Laser, Pharm D 11/13/2015 11:26 AM

## 2015-11-13 NOTE — Care Management Obs Status (Signed)
[facility] NOTIFICATION   Patient Details  Name: Ronnie Chan MRN: [id number] Date of Birth: 27-Jul-1925   Medicare Observation Status Notification Given:  Yes    Dawayne Patricia, RN 11/13/2015, 3:21 PM

## 2015-11-13 NOTE — Consult Note (Addendum)
[facility] Consult: 10:57 AM 11/13/2015     Referring Provider: Dr Cruzita Lederer  Primary Care Physician:  Elsie Stain, MD Primary Gastroenterologist:  Originally Dr Velora Heckler. Then several [facility] MDs during inpt stay in 2011.     Reason for Consultation:  Anemia , FOBT +.     HPI: Ronnie Chan is a 82 y.o. male.   Chronic eliquis for a fib since diagnosis in summer 2017.  EF 04/2015 was 55 to 60% with mild to moderate valvular regurge.  Remote TIA 1980s.  CKD stage 3.    Lower Gi bleed in 2011, presumed diverticular source. transfeused 4 PRBCs then. 11/2009 Capsule endoscopy.  Dr Fuller Plan.  Good study and prep.  Small duodenal AVM. Smooth, large, whitish protruding lesion at 1 hr and 48 minutes ? Lymphoid but larger than what is usually seen, no evidence of ulceration. 11/2009 EGD.  Dr Henrene Pastor.  For hematochezia and r/o UGI source.  Normal EGD.   11/2009 Nuc Med RBC scan.  Negative for active bleeding.  10/2009 Colonoscopy: for hematochezia.  Dr Benson Norway. Diverticulosis, cecal polyp (tubular adenoma), hemorrhoids.   Several months of progressive fatigue and DOE, some increase in chronic cough. In last 10 days, cough is "nasty"  With yellow/thick sputum. Has some chest pressure with increased exertion and when he gets stressed out.  But with slow/steady activity no chest pain.  Just tires out very easily.  Cares for invalid, bed bound, demented wife at home. Dtr in law who does his laundry will see blood staining on his underwear infrequently.  However pt himself says he has seen no BPR and stools are brown, mostly occur every day. He was FOBT negative in 03/2015. Appetite fair.  No dysphagia, no heartburn.   Weight down ~ 20 # in last 5 years.  Called PMD yesterday due to the increased cough and fatigue, sent to ED.  Hgb is  9.4, was ~ 13 in 03/2015.  MCV 81, was 88 in 03/2015.  Iron and iron sats depressed.  Ferritin 10. B12 WNL.   GFR is 39: CKD stage 3.  Troponins 0.11, 0.13 x 2.  BNP elevated at 742.   CXR with hyperinflated lungs.    This AM had short run of wide QRS complex.   Past Medical History  Diagnosis Date  . history of mini stroke 15    Dr. Earley Favor  . Diverticulitis of colon with bleeding 03/11    Hospital 03/25-03/29/11, ARF due dehydr, acute blood loss anemia due divertic bleed  . Lower GI bleed 04/11     acute blood loss anemia, transfusion 4 units PRBC's, non stemi  . History of CT scan of abdomen 11/29/09    No acute process, large R parapelvic renal cyst calcull  . History of ETT 11/24/09    myoview, nml EF 48%  . B12 deficiency   . CKD (chronic kidney disease)     stage 3 in 03/2015  . Atrial fibrillation [facility]) summer 2017    Eliquis initiated.     Past Surgical History  Procedure Laterality Date  . Tracheostomy  7 yoa    Strep throat  . Septoplasty  1990    Dr. Ernesto Rutherford, left  . Cataract extraction, bilateral  09/1997- 10/1997    Prior to Admission medications   Medication Sig Start Date End Date Taking? Authorizing Provider  ELIQUIS 5 MG TABS tablet TAKE 1 TABLET (5 MG TOTAL) BY MOUTH 2 (TWO) TIMES DAILY. 10/07/15  Yes Minna Merritts, MD  pantoprazole (PROTONIX) 40 MG tablet TAKE 1 TABLET (40 MG TOTAL) BY MOUTH DAILY. 11/11/15  Yes Tonia Ghent, MD  sertraline (ZOLOFT) 25 MG tablet Take 1 tablet (25 mg total) by mouth daily. 02/22/15  Yes Tonia Ghent, MD  simvastatin (ZOCOR) 20 MG tablet TAKE 1 TABLET (20 MG TOTAL) BY MOUTH AT BEDTIME. 10/21/15  Yes Tonia Ghent, MD  tamsulosin (FLOMAX) 0.4 MG CAPS capsule TAKE 1 CAPSULE (0.4 MG TOTAL) BY MOUTH DAILY AFTER BREAKFAST. 06/10/15  Yes Tonia Ghent, MD    Scheduled Meds: . pantoprazole  40 mg Oral Daily  . sertraline  25 mg Oral Daily  . simvastatin  20 mg Oral q1800  . sodium chloride flush  3 mL Intravenous  Q12H  . tamsulosin  0.4 mg Oral QPC breakfast   Infusions: . sodium chloride 50 mL/hr at 11/12/15 2045   PRN Meds: acetaminophen **OR** acetaminophen, albuterol, HYDROcodone-acetaminophen, ondansetron **OR** ondansetron (ZOFRAN) IV, senna-docusate   Allergies as of 11/12/2015 - Review Complete 11/12/2015  Allergen Reaction Noted  . Lisinopril Other (See Comments) 04/16/2011  . Simvastatin Other (See Comments) 01/22/2015  . Sulfadiazine Other (See Comments)     Family History  Problem Relation Age of Onset  . Heart disease Father     CHF  . Heart disease Paternal Grandfather     heart attacks    Social History   Social History  . Marital Status: Married    Spouse Name: N/A  . Number of Children: 2  . Years of Education: N/A   Occupational History  . Retired Education officer, [employer] of [employer]    Social History Main Topics  . Smoking status: Never Smoker   . Smokeless tobacco: Never Used  . Alcohol Use: No  . Drug Use: No  . Sexual Activity: Not on file   Other Topics Concern  . Not on file   Social History Narrative   Wife on hospice as of 2015    REVIEW OF SYSTEMS: Constitutional:  Per HPI ENT:  No nose bleeds Pulm:  Per HPI CV:  Chest pain per HPI, no LE edema.  GU:  No hematuria, no frequency GI:  Per HPI Heme:  No unusual bleeding.  Does bruise easily but no large hematomas.  Last B12 shot was 08/2015 and Dr Damita Dunnings has been giving shots less frequently in the last year, pt says MD says he does not need them as often.    Transfusions:  Per HPI Neuro:  No headaches.  + loss sensation in both feet Derm:  No itching, no rash or sores.  Endocrine:  No sweats or chills.  No polyuria or dysuria Immunization:  Reviewed.   Travel:  None beyond local counties in last few months.    PHYSICAL EXAM: Vital signs in last 24 hours: Filed Vitals:   11/13/15 0200 11/13/15 0514  BP: 115/61 108/65  Pulse:  95  Temp:  98.7 F (37.1 C)  Resp:  20   Wt Readings  from Last 3 Encounters:  05/30/15 65.545 kg (  144 lb 8 oz)  04/16/15 66.906 kg (147 lb 8 oz)  04/02/15 67.473 kg (148 lb 12 oz)    General: pleasant, alert, somewhat frail but does not look  ill Head:  No asymmetry or swelling  Eyes:  No icterus.  Conjunctiva moderately pale Ears:  HOH  Nose:  No discharge Mouth:  Many teeth gone but remainder not in poor shape.  Neck:  No mass, no JVD, no TMG Lungs:  Clear bil.  + cough.  No SOB with speaking Heart: Irreg, irreg.  Rate not accelerated.  Abdomen:  Soft, NT, ND.  Not obese.  No mass, no HSM, no hernias.  BS active.   Rectal: deferred   Musc/Skeltl: no joint redness or swelling.  Some arthritic changes in hands.  Kyphosis.  Extremities:  No CCE  Neurologic:  Oriented x 3.  HOH.  No tremor.   No limb weakness Skin:  No telangectasia, rash or sores.   Tattoos:  none Nodes:  No cervical or inguinal adenopathy   Psych:  Friendly,cooperative.  In good spirits.   Intake/Output from previous day: 03/21 0701 - 03/22 0700 In: 702.5 [P.O.:240; I.V.:462.5] Out: 525 [Urine:525] Intake/Output this shift:    LAB RESULTS:  Recent Labs  11/12/15 1128  WBC 8.4  HGB 9.4*  HCT 31.8*  PLT 244   BMET Lab Results  Component Value Date   NA 142 11/12/2015   NA 140 04/16/2015   NA 141 03/26/2015   K 4.9 11/12/2015   K 4.2 04/16/2015   K 4.4 03/26/2015   CL 106 11/12/2015   CL 105 04/16/2015   CL 105 03/26/2015   CO2 26 11/12/2015   CO2 27 04/16/2015   CO2 32 03/26/2015   GLUCOSE 123* 11/12/2015   GLUCOSE 80 04/16/2015   GLUCOSE 89 03/26/2015   BUN 31* 11/12/2015   BUN 24* 04/16/2015   BUN 27* 03/26/2015   CREATININE 1.52* 11/12/2015   CREATININE 1.30 04/16/2015   CREATININE 1.49 03/26/2015   CALCIUM 9.6 11/12/2015   CALCIUM 9.6 04/16/2015   CALCIUM 9.8 03/26/2015   LFT No results for input(s): PROT, ALBUMIN, AST, ALT, ALKPHOS, BILITOT, BILIDIR, IBILI in the last 72 hours. PT/INR Lab Results  Component Value Date   INR  1.67* 11/12/2015   INR 1.14 11/21/2009   INR 1.21 11/15/2009   Hepatitis Panel No results for input(s): HEPBSAG, HCVAB, HEPAIGM, HEPBIGM in the last 72 hours. C-Diff No components found for: CDIFF Lipase     Component Value Date/Time   LIPASE 37 04/18/2014 1045    Drugs of Abuse  No results found for: LABOPIA, COCAINSCRNUR, LABBENZ, AMPHETMU, THCU, LABBARB   RADIOLOGY STUDIES: Dg Chest 2 View  11/12/2015  CLINICAL DATA:  Short of breath and fatigue. EXAM: CHEST  2 VIEW COMPARISON:  11/15/2009 FINDINGS: Normal cardiac silhouette. Lungs are hyperinflated. No effusion, infiltrate or pneumothorax. Calcification of the thoracic aorta. IMPRESSION: Hyperinflated lungs without acute findings. Electronically Signed   By: Suzy Bouchard M.D.   On: 11/12/2015 12:41    ENDOSCOPIC STUDIES: Per HPI   IMPRESSION:   *  Anemia, FOBT +.  Iron deficient.   Hx diverticular bleed in 2011.  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  Suspect this along with the Afib are responsible for his fatigue and exhaustion.   *  Eliquis for a fib.   *  Hx GERD, remote EGD in 1990s with dilation of esoph stricture.  Dysphagia is not a problem for him.  Daily  PPI at home.   *  Stage 3 CKD.  May be contributing to anemia.     PLAN:     *  Per Dr Fuller Plan.    *  Will ask pharmacy to dose parenteral iron. At his age, he may not absorb po iron that well.     Azucena Freed  11/13/2015, 10:57 AM Pager: [phone]      Attending physician's note   I have taken a history, examined the patient and reviewed the chart. I agree with the Advanced Practitioner's note, impression and recommendations. Iron deficiency and heme positive stool on Eliquis. Likely has chronic GI losses from known SB AVMs found on GI evaluation in 2011. Recommended colonoscopy and EGD given that is has been 6 years since prior GI work up. If primary service plans for further evaluation of chest pain will wait until that evaluation is completed. IV  FE to rapidly replace Fe stores. Hold Eliquis for possible colonoscopy and EGD.   Lucio Edward, MD [facility] [phone] Mon-Fri 8a-5p [phone] after 5p, weekends, holidays

## 2015-11-14 DIAGNOSIS — D509 Iron deficiency anemia, unspecified: Secondary | ICD-10-CM | POA: Insufficient documentation

## 2015-11-14 DIAGNOSIS — I481 Persistent atrial fibrillation: Secondary | ICD-10-CM | POA: Diagnosis not present

## 2015-11-14 DIAGNOSIS — K922 Gastrointestinal hemorrhage, unspecified: Secondary | ICD-10-CM | POA: Diagnosis not present

## 2015-11-14 DIAGNOSIS — Z7901 Long term (current) use of anticoagulants: Secondary | ICD-10-CM | POA: Insufficient documentation

## 2015-11-14 DIAGNOSIS — R195 Other fecal abnormalities: Secondary | ICD-10-CM | POA: Insufficient documentation

## 2015-11-14 DIAGNOSIS — R079 Chest pain, unspecified: Secondary | ICD-10-CM | POA: Diagnosis not present

## 2015-11-14 LAB — CBC
HCT: 32.3 % — ABNORMAL LOW (ref 39.0–52.0)
HEMOGLOBIN: 9.5 g/dL — AB (ref 13.0–17.0)
MCH: 23.9 pg — ABNORMAL LOW (ref 26.0–34.0)
MCHC: 29.4 g/dL — ABNORMAL LOW (ref 30.0–36.0)
MCV: 81.2 fL (ref 78.0–100.0)
PLATELETS: 243 10*3/uL (ref 150–400)
RBC: 3.98 MIL/uL — AB (ref 4.22–5.81)
RDW: 14.9 % (ref 11.5–15.5)
WBC: 7.1 10*3/uL (ref 4.0–10.5)

## 2015-11-14 LAB — BASIC METABOLIC PANEL
Anion gap: 8 (ref 5–15)
BUN: 25 mg/dL — AB (ref 6–20)
CHLORIDE: 105 mmol/L (ref 101–111)
CO2: 24 mmol/L (ref 22–32)
CREATININE: 1.53 mg/dL — AB (ref 0.61–1.24)
Calcium: 9.1 mg/dL (ref 8.9–10.3)
GFR, EST AFRICAN AMERICAN: 45 mL/min — AB (ref >60)
GFR, EST NON AFRICAN AMERICAN: 39 mL/min — AB (ref >60)
Glucose, Bld: 112 mg/dL — ABNORMAL HIGH (ref 65–99)
POTASSIUM: 5.6 mmol/L — AB (ref 3.5–5.1)
SODIUM: 137 mmol/L (ref 135–145)

## 2015-11-14 LAB — POTASSIUM: POTASSIUM: 4.8 mmol/L (ref 3.5–5.1)

## 2015-11-14 MED ORDER — PEG-KCL-NACL-NASULF-NA ASC-C 100 G PO SOLR
1.0000 | Freq: Once | ORAL | Status: AC
  Administered 2015-11-14: 200 g via ORAL
  Filled 2015-11-14: qty 1

## 2015-11-14 MED ORDER — BISACODYL 5 MG PO TBEC
10.0000 mg | DELAYED_RELEASE_TABLET | Freq: Once | ORAL | Status: AC
  Administered 2015-11-14: 10 mg via ORAL
  Filled 2015-11-14: qty 2

## 2015-11-14 NOTE — Progress Notes (Signed)
PROGRESS NOTE  Ronnie Chan GXQ:[id number] DOB: 11/13/1924 DOA: 11/12/2015 PCP: Elsie Stain, MD Outpatient Specialists:      Brief Narrative: 82 y.o. male with a Past Medical History of TIA, diverticulitis, GI bleed who presents with CP, chronic cough, worsening anemia. Found to have positive FOBT and slight troponin elevation.  Assessment & Plan: Active Problems:   Atrial fibrillation (HCC)   Anemia   Absolute anemia   Pain in the chest   Bleeding gastrointestinal   Chest pain - typical and atypical components - slight troponin elevation likely due to demand ischemia from anemia - cardiology consulted, appreciate input - Heart rate control for his A. fib, otherwise no other investigations planned per cardiology  Acute blood loss anemia with underlying GI bleeding - positive fecal occult, patient denies any bleeding but the son who is bedside states that occasionally family would notice blood on patient's underpants occasionally - consulted GI, appreciate input - iron deficient, IV iron today - hold Eliquis - Plan for colonoscopy tomorrow. Gastroenterology  A fib - on amiodarone, heart rate into the 70s this morning - hold Eliquis for now given #2  Chronic cough - CXR clear, perhaps chronic bronchitis  AKI on CKD stage 3 - renal function stable, continue to monitor  Hyperlipidemia.  - continue home statin  GERD.  - continue home PPI  Depression. Stable - continue zoloft  DVT prophylaxis: SCD Code Status: Full code Family Communication: No family at bedside Disposition Plan: home when ready Barriers for discharge: GI and cards evaluation; colonoscopy tomorrow  Consultants:   GI  Cardiology  Procedures:   none  Antimicrobials:  none   Subjective: - Patient has no chest and, denies any palpitations, denies any abdominal pain, nausea, vomiting  - He had diarrhea overnight  Objective: Filed Vitals:   11/13/15 1322 11/13/15 1509 11/14/15  0118 11/14/15 0357  BP: 116/58 116/71 110/70 100/61  Pulse: 97 99 80 64  Temp: 98 F (36.7 C)   97.7 F (36.5 C)  TempSrc: Oral   Oral  Resp: 18   18  SpO2: 99%  99% 96%   No intake or output data in the 24 hours ending 11/14/15 1044 There were no vitals filed for this visit.  Examination: BP 100/61 mmHg  Pulse 64  Temp(Src) 97.7 F (36.5 C) (Oral)  Resp 18  SpO2 96%  GENERAL: NAD  HEENT: head NCAT, no scleral icterus. Pupils round and reactive.   NECK: Supple. No carotid bruits. No lymphadenopathy or thyromegaly.  LUNGS: Clear to auscultation. No wheezing or crackles  HEART: Irregular. 2+ pulses, no JVD, no peripheral edema  ABDOMEN: Soft, nontender, and nondistended. Positive bowel sounds.   EXTREMITIES: Without any cyanosis or clubbing  NEUROLOGIC: Alert and oriented x3. Cranial nerves II through XII are grossly intact. Strength 5/5 in all 4.  Data Reviewed: I have personally reviewed following labs and imaging studies  CBC:  Recent Labs Lab 11/12/15 1128 11/12/15 1905 11/14/15 0330  WBC 8.4  --  7.1  HGB 9.4*  --  9.5*  HCT 31.8* 28.5* 32.3*  MCV 81.1  --  81.2  PLT 244  --  144   Basic Metabolic Panel:  Recent Labs Lab 11/12/15 1128 11/12/15 2220 11/14/15 0330  NA 142  --  137  K 4.9  --  5.6*  CL 106  --  105  CO2 26  --  24  GLUCOSE 123*  --  112*  BUN 31*  --  25*  CREATININE 1.52*  --  1.53*  CALCIUM 9.6  --  9.1  MG  --  2.0  --    GFR: CrCl cannot be calculated (Unknown ideal weight.). Liver Function Tests: No results for input(s): AST, ALT, ALKPHOS, BILITOT, PROT, ALBUMIN in the last 168 hours. No results for input(s): LIPASE, AMYLASE in the last 168 hours. No results for input(s): AMMONIA in the last 168 hours. Coagulation Profile:  Recent Labs Lab 11/12/15 2220  INR 1.67*   Cardiac Enzymes:  Recent Labs Lab 11/12/15 1646 11/12/15 1905 11/12/15 2220  TROPONINI 0.11* 0.13* 0.13*   BNP (last 3 results) No results  for input(s): PROBNP in the last 8760 hours. HbA1C: No results for input(s): HGBA1C in the last 72 hours. CBG: No results for input(s): GLUCAP in the last 168 hours. Lipid Profile: No results for input(s): CHOL, HDL, LDLCALC, TRIG, CHOLHDL, LDLDIRECT in the last 72 hours. Thyroid Function Tests: No results for input(s): TSH, T4TOTAL, FREET4, T3FREE, THYROIDAB in the last 72 hours. Anemia Panel:  Recent Labs  11/12/15 1905  VITAMINB12 396  FERRITIN 10*  TIBC 344  IRON 12*   Urine analysis:    Component Value Date/Time   COLORURINE YELLOW 04/18/2014 [place] 04/18/2014 1155   LABSPEC 1.016 04/18/2014 1155   PHURINE 7.0 04/18/2014 1155   GLUCOSEU NEGATIVE 04/18/2014 1155   HGBUR SMALL* 04/18/2014 [place] 04/18/2014 1155   KETONESUR NEGATIVE 04/18/2014 1155   PROTEINUR NEGATIVE 04/18/2014 1155   UROBILINOGEN 1.0 04/18/2014 1155   NITRITE NEGATIVE 04/18/2014 1155   LEUKOCYTESUR TRACE* 04/18/2014 1155   Sepsis Labs: Invalid input(s): PROCALCITONIN, LACTICIDVEN  No results found for this or any previous visit (from the past 240 hour(s)).    Radiology Studies: Dg Chest 2 View  11/12/2015  CLINICAL DATA:  Short of breath and fatigue. EXAM: CHEST  2 VIEW COMPARISON:  11/15/2009 FINDINGS: Normal cardiac silhouette. Lungs are hyperinflated. No effusion, infiltrate or pneumothorax. Calcification of the thoracic aorta. IMPRESSION: Hyperinflated lungs without acute findings. Electronically Signed   By: Suzy Bouchard M.D.   On: 11/12/2015 12:41     Scheduled Meds: . metoprolol tartrate  25 mg Oral BID  . pantoprazole  40 mg Oral Daily  . peg 3350 powder  1 kit Oral Once  . sertraline  25 mg Oral Daily  . simvastatin  20 mg Oral q1800  . sodium chloride flush  3 mL Intravenous Q12H  . tamsulosin  0.4 mg Oral QPC breakfast   Continuous Infusions:    Marzetta Board, MD, PhD [facility] Pager [phone]  If 7PM-7AM, please  contact night-coverage [email or web address] Password [facility] 11/14/2015, 10:44 AM

## 2015-11-14 NOTE — Progress Notes (Signed)
          Daily Rounding Note  11/14/2015, 9:44 AM    SUBJECTIVE:       Loose BMs x overnight.  No abd pain.  Stools dark brown, no obvious blood.  No nausea, no abd pain.  No dizziness, no SOB.  OBJECTIVE:         Vital signs in last 24 hours:    Temp:  [97.7 F (36.5 C)-98 F (36.7 C)] 97.7 F (36.5 C) (03/23 0357) Pulse Rate:  [64-99] 64 (03/23 0357) Resp:  [18] 18 (03/23 0357) BP: (100-116)/(58-71) 100/61 mmHg (03/23 0357) SpO2:  [96 %-99 %] 96 % (03/23 0357) Last BM Date: 11/13/15 There were no vitals filed for this visit.   General: looks somewhat frail.  HOH.  Comfortable, not ill looking    Heart: RRR Chest: clear bil Abdomen: soft, active BS, NT, no masses  Extremities: no CCE Neuro/Psych:  Oriented x 3.  Calm.  In good spirits.  No gross weakness or deficits.   Intake/Output from previous day:    Intake/Output this shift:    Lab Results:  Recent Labs  11/12/15 1128 11/12/15 1905 11/14/15 0330  WBC 8.4  --  7.1  HGB 9.4*  --  9.5*  HCT 31.8* 28.5* 32.3*  PLT 244  --  243   BMET  Recent Labs  11/12/15 1128 11/14/15 0330  NA 142 137  K 4.9 5.6*  CL 106 105  CO2 26 24  GLUCOSE 123* 112*  BUN 31* 25*  CREATININE 1.52* 1.53*  CALCIUM 9.6 9.1   LFT No results for input(s): PROT, ALBUMIN, AST, ALT, ALKPHOS, BILITOT, BILIDIR, IBILI in the last 72 hours. PT/INR  Recent Labs  11/12/15 2220  LABPROT 19.7*  INR 1.67*    Studies/Results: Dg Chest 2 View  11/12/2015  CLINICAL DATA:  Short of breath and fatigue. EXAM: CHEST  2 VIEW COMPARISON:  11/15/2009 FINDINGS: Normal cardiac silhouette. Lungs are hyperinflated. No effusion, infiltrate or pneumothorax. Calcification of the thoracic aorta. IMPRESSION: Hyperinflated lungs without acute findings. Electronically Signed   By: Suzy Bouchard M.D.   On: 11/12/2015 12:41    ASSESMENT:   * Anemia, FOBT +. Iron deficient.  Hx diverticular  bleed in 2011. 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  Suspect this along with the Afib are responsible for his fatigue and exhaustion.  Hgb stable.   Received infed dose 3/22  * Eliquis for a fib. On hold.   *  Loose stools, C diff ordered by hospitalist.  Doubt C diff.   * Hx GERD, remote EGD in 1990s with dilation of esoph stricture. Dysphagia is not a problem for him. Daily PPI at home.   * Stage 3 CKD. May be contributing to anemia.     PLAN   *  Colonoscopy and egd tomorrow at 10 AM.  Prep orders written    Azucena Freed  11/14/2015, 9:44 AM Pager: [phone]     Attending physician's note   I have taken an interval history, reviewed the chart and examined the patient. I agree with the Advanced Practitioner's note, impression and recommendations. Continue to hold Eliquis. Hb stable at 9.5. Colonoscopy and egd tomorrow to further evaluate heme + stool and iron deficiency anemia.   Lucio Edward, MD [facility] [phone] Mon-Fri 8a-5p [phone] after 5p, weekends, holidays

## 2015-11-14 NOTE — Progress Notes (Signed)
Patient Name: Ronnie Chan Date of Encounter: 11/14/2015   SUBJECTIVE  Feeling well. No chest pain, sob or palpitations.   CURRENT MEDS . metoprolol tartrate  25 mg Oral BID  . pantoprazole  40 mg Oral Daily  . peg 3350 powder  1 kit Oral Once  . sertraline  25 mg Oral Daily  . simvastatin  20 mg Oral q1800  . sodium chloride flush  3 mL Intravenous Q12H  . tamsulosin  0.4 mg Oral QPC breakfast    OBJECTIVE  Filed Vitals:   11/13/15 1509 11/14/15 0118 11/14/15 0357 11/14/15 1121  BP: 116/71 110/70 100/61 118/76  Pulse: 99 80 64 102  Temp:   97.7 F (36.5 C)   TempSrc:   Oral   Resp:   18   SpO2:  99% 96%    No intake or output data in the 24 hours ending 11/14/15 1330 There were no vitals filed for this visit.  PHYSICAL EXAM  General: Pleasant, NAD. Neuro: Alert and oriented X 3. Moves all extremities spontaneously. Psych: Normal affect. HEENT:  Normal  Neck: Supple without bruits. + JVD. Lungs:  Resp regular and unlabored, CTA. Heart: RRR no s3, s4, or murmurs. Abdomen: Soft, non-tender, non-distended, BS + x 4.  Extremities: No clubbing, cyanosis or edema. DP/PT/Radials 2+ and equal bilaterally.  Accessory Clinical Findings  CBC  Recent Labs  11/12/15 1128 11/12/15 1905 11/14/15 0330  WBC 8.4  --  7.1  HGB 9.4*  --  9.5*  HCT 31.8* 28.5* 32.3*  MCV 81.1  --  81.2  PLT 244  --  858   Basic Metabolic Panel  Recent Labs  11/12/15 1128 11/12/15 2220 11/14/15 0330  NA 142  --  137  K 4.9  --  5.6*  CL 106  --  105  CO2 26  --  24  GLUCOSE 123*  --  112*  BUN 31*  --  25*  CREATININE 1.52*  --  1.53*  CALCIUM 9.6  --  9.1  MG  --  2.0  --    Liver Function Tests No results for input(s): AST, ALT, ALKPHOS, BILITOT, PROT, ALBUMIN in the last 72 hours. No results for input(s): LIPASE, AMYLASE in the last 72 hours. Cardiac Enzymes  Recent Labs  11/12/15 1646 11/12/15 1905 11/12/15 2220  TROPONINI 0.11* 0.13* 0.13*     TELE  Afib and few small runs of NSVT  Radiology/Studies  Dg Chest 2 View  11/12/2015  CLINICAL DATA:  Short of breath and fatigue. EXAM: CHEST  2 VIEW COMPARISON:  11/15/2009 FINDINGS: Normal cardiac silhouette. Lungs are hyperinflated. No effusion, infiltrate or pneumothorax. Calcification of the thoracic aorta. IMPRESSION: Hyperinflated lungs without acute findings. Electronically Signed   By: Suzy Bouchard M.D.   On: 11/12/2015 12:41    ASSESSMENT AND PLAN   1. Chest pain with minimally elevated troponin with flat trend - With typical and atypical features. EKG without acute changes. No inpatient work up needed at this time.   2. Chronic Afib - CHADSVASCs score of at least 4 (age, TIA) ? CHF. Eliquis is on hold due to + stool guiac. Will need GI clearance prior to restart.  - Echocardiogram 04/2015 shows left ventricular function of 55-60%, mild concentric hypertrophy, no WM abnormality, mild aortic regurgitation, moderate mitral regurgitation, moderate to severely dilated left atrium, mild dilated right atrium, PA peak pressure of 46 mm Hg.  - Rate improved on lopressor. Continue at current dose. BP  stable.   3. Acute blood loss Anemia - Plan for EGD and colon tomorrow  4. Acute diastolic CHF  - BNP of 594.5-->859.2. Minimal volume over load on exam with elevated JVD vs carotid pulsation.  Likely related to afib and anemia. Auto diuresis.   6. Acute on chronic kidney disease, stage 3 - Baseline Cr 1.3-1.4. Scr stable 1.5 this admission.   6. NSVT - Again had few small runs of NSVT this morning. Mg normal. K of 5.6 this morning. Will get start K check. Added lopressor.    Signed, Bhagat,Bhavinkumar PA-C Pager [phone]  Pt seen and examined  Agree wth findings of B  Bhagat above  Pt remains in afib   Rates controlle   Lungs Rel clear  Card  Irreg Irreg   Ext No signif edema    Note plans for EGD/colon tomorrow   Dorris Carnes

## 2015-11-15 ENCOUNTER — Encounter (HOSPITAL_COMMUNITY): Payer: Self-pay

## 2015-11-15 ENCOUNTER — Encounter (HOSPITAL_COMMUNITY)
Admission: EM | Disposition: A | Discharge status: Home / Self Care | Payer: Self-pay | Source: Home / Self Care | Attending: Emergency Medicine

## 2015-11-15 DIAGNOSIS — K317 Polyp of stomach and duodenum: Secondary | ICD-10-CM | POA: Diagnosis not present

## 2015-11-15 DIAGNOSIS — R079 Chest pain, unspecified: Secondary | ICD-10-CM | POA: Diagnosis not present

## 2015-11-15 DIAGNOSIS — D509 Iron deficiency anemia, unspecified: Secondary | ICD-10-CM | POA: Diagnosis not present

## 2015-11-15 DIAGNOSIS — R195 Other fecal abnormalities: Secondary | ICD-10-CM | POA: Diagnosis not present

## 2015-11-15 DIAGNOSIS — D120 Benign neoplasm of cecum: Secondary | ICD-10-CM | POA: Diagnosis not present

## 2015-11-15 DIAGNOSIS — I481 Persistent atrial fibrillation: Secondary | ICD-10-CM | POA: Diagnosis not present

## 2015-11-15 HISTORY — PX: ESOPHAGOGASTRODUODENOSCOPY: SHX5428

## 2015-11-15 HISTORY — PX: COLONOSCOPY: SHX5424

## 2015-11-15 LAB — BASIC METABOLIC PANEL
ANION GAP: 10 (ref 5–15)
BUN: 29 mg/dL — AB (ref 6–20)
CALCIUM: 8.8 mg/dL — AB (ref 8.9–10.3)
CHLORIDE: 111 mmol/L (ref 101–111)
CO2: 21 mmol/L — AB (ref 22–32)
CREATININE: 1.67 mg/dL — AB (ref 0.61–1.24)
GFR calc non Af Amer: 35 mL/min — ABNORMAL LOW (ref >60)
GFR, EST AFRICAN AMERICAN: 40 mL/min — AB (ref >60)
GLUCOSE: 87 mg/dL (ref 65–99)
Potassium: 4.1 mmol/L (ref 3.5–5.1)
SODIUM: 142 mmol/L (ref 135–145)

## 2015-11-15 LAB — CBC
HCT: 31.4 % — ABNORMAL LOW (ref 39.0–52.0)
Hemoglobin: 9.4 g/dL — ABNORMAL LOW (ref 13.0–17.0)
MCH: 24 pg — AB (ref 26.0–34.0)
MCHC: 29.9 g/dL — AB (ref 30.0–36.0)
MCV: 80.3 fL (ref 78.0–100.0)
PLATELETS: 241 10*3/uL (ref 150–400)
RBC: 3.91 MIL/uL — AB (ref 4.22–5.81)
RDW: 15.1 % (ref 11.5–15.5)
WBC: 8.4 10*3/uL (ref 4.0–10.5)

## 2015-11-15 SURGERY — EGD (ESOPHAGOGASTRODUODENOSCOPY)
Anesthesia: Moderate Sedation

## 2015-11-15 MED ORDER — MIDAZOLAM HCL 5 MG/ML IJ SOLN
INTRAMUSCULAR | Status: AC
  Filled 2015-11-15: qty 2

## 2015-11-15 MED ORDER — SODIUM CHLORIDE 0.9 % IV SOLN: INTRAVENOUS | Status: DC

## 2015-11-15 MED ORDER — FENTANYL CITRATE (PF) 100 MCG/2ML IJ SOLN
INTRAMUSCULAR | Status: DC | PRN
  Administered 2015-11-15: 25 ug via INTRAVENOUS

## 2015-11-15 MED ORDER — DIPHENHYDRAMINE HCL 50 MG/ML IJ SOLN
INTRAMUSCULAR | Status: AC
  Filled 2015-11-15: qty 1

## 2015-11-15 MED ORDER — MIDAZOLAM HCL 10 MG/2ML IJ SOLN
INTRAMUSCULAR | Status: DC | PRN
Start: 2015-11-15 — End: 2015-11-15
  Administered 2015-11-15: .5 mg via INTRAVENOUS
  Administered 2015-11-15: 2 mg via INTRAVENOUS
  Administered 2015-11-15: 1 mg via INTRAVENOUS

## 2015-11-15 MED ORDER — EPINEPHRINE HCL 0.1 MG/ML IJ SOSY
PREFILLED_SYRINGE | INTRAMUSCULAR | Status: AC
Start: 2015-11-15 — End: 2015-11-15
  Filled 2015-11-15: qty 10

## 2015-11-15 MED ORDER — FENTANYL CITRATE (PF) 100 MCG/2ML IJ SOLN
INTRAMUSCULAR | Status: AC
  Filled 2015-11-15: qty 4

## 2015-11-15 NOTE — Progress Notes (Signed)
PROGRESS NOTE  UNNAMED DAZEY [id number] DOB: 11-Apr-1925 DOA: 11/12/2015 PCP: Elsie Stain, MD Outpatient Specialists:      Brief Narrative: 82 y.o. male with a Past Medical History of TIA, diverticulitis, GI bleed who presents with CP, chronic cough, worsening anemia. Found to have positive FOBT and slight troponin elevation.  Assessment & Plan: Active Problems:   Atrial fibrillation (HCC)   Anemia   Absolute anemia   Pain in the chest   Bleeding gastrointestinal   Anemia, iron deficiency   Occult blood in stools   Chronic anticoagulation   Chest pain - typical and atypical components - slight troponin elevation likely due to demand ischemia from anemia - cardiology consulted, appreciate input. No further evaluation, continue medical management - Heart rate control for his A. fib, otherwise no other investigations planned per cardiology  Acute blood loss anemia with underlying GI bleeding - positive fecal occult, patient denies any bleeding but the son who is bedside states that occasionally family would notice blood on patient's underpants occasionally - consulted GI, appreciate input - iron deficient, IV iron today - hold Eliquis - colon/egd today   A fib - on amiodarone, heart rate into the 70s this morning - hold Eliquis for now given #2. May resume if colon/egd normal  Chronic cough - CXR clear, perhaps chronic bronchitis  AKI on CKD stage 3 - renal function overall stable, continue to monitor  Hyperlipidemia.  - continue home statin  GERD.  - continue home PPI  Depression. Stable - continue zoloft  DVT prophylaxis: SCD Code Status: Full code Family Communication: d/w son bedside Disposition Plan: home when ready, likely 1 day Barriers for discharge: colon/egd today  Consultants:   GI  Cardiology  Procedures:   none  Antimicrobials:  none   Subjective: - did not sleep well overnight due to prep - no chest pain /  palpitations  Objective: Filed Vitals:   11/14/15 2057 11/15/15 0427 11/15/15 1017 11/15/15 1110  BP: 111/68 109/60 121/82 126/51  Pulse: 97 68 80 88  Temp: 97.7 F (36.5 C) 97.5 F (36.4 C) 98 F (36.7 C)   TempSrc: Oral Oral Oral   Resp: 18 17 15 16   SpO2: 100% 99% 93% 99%    Intake/Output Summary (Last 24 hours) at 11/15/15 1136 Last data filed at 11/15/15 [id number]  Gross per 24 hour  Intake      0 ml  Output   1050 ml  Net  -1050 ml   There were no vitals filed for this visit.  Examination: BP 126/51 mmHg  Pulse 88  Temp(Src) 98 F (36.7 C) (Oral)  Resp 16  SpO2 99%  GENERAL: NAD  HEENT: head NCAT, no scleral icterus. Pupils round and reactive.   NECK: Supple. No carotid bruits. No lymphadenopathy or thyromegaly.  LUNGS: Clear to auscultation. No wheezing or crackles  HEART: Irregular. 2+ pulses, no JVD, no peripheral edema  ABDOMEN: Soft, nontender, and nondistended. Positive bowel sounds.   EXTREMITIES: Without any cyanosis or clubbing   Data Reviewed: I have personally reviewed following labs and imaging studies  CBC:  Recent Labs Lab 11/12/15 1128 11/12/15 1905 11/14/15 0330 11/15/15 0502  WBC 8.4  --  7.1 8.4  HGB 9.4*  --  9.5* 9.4*  HCT 31.8* 28.5* 32.3* 31.4*  MCV 81.1  --  81.2 80.3  PLT 244  --  243 [id number]   Basic Metabolic Panel:  Recent Labs Lab 11/12/15 1128 11/12/15 2220 11/14/15 0330 11/14/15 1415 11/15/15  0502  NA 142  --  137  --  142  K 4.9  --  5.6* 4.8 4.1  CL 106  --  105  --  111  CO2 26  --  24  --  21*  GLUCOSE 123*  --  112*  --  87  BUN 31*  --  25*  --  29*  CREATININE 1.52*  --  1.53*  --  1.67*  CALCIUM 9.6  --  9.1  --  8.8*  MG  --  2.0  --   --   --    GFR: CrCl cannot be calculated (Unknown ideal weight.). Liver Function Tests: No results for input(s): AST, ALT, ALKPHOS, BILITOT, PROT, ALBUMIN in the last 168 hours. No results for input(s): LIPASE, AMYLASE in the last 168 hours. No results for  input(s): AMMONIA in the last 168 hours. Coagulation Profile:  Recent Labs Lab 11/12/15 2220  INR 1.67*   Cardiac Enzymes:  Recent Labs Lab 11/12/15 1646 11/12/15 1905 11/12/15 2220  TROPONINI 0.11* 0.13* 0.13*   BNP (last 3 results) No results for input(s): PROBNP in the last 8760 hours. HbA1C: No results for input(s): HGBA1C in the last 72 hours. CBG: No results for input(s): GLUCAP in the last 168 hours. Lipid Profile: No results for input(s): CHOL, HDL, LDLCALC, TRIG, CHOLHDL, LDLDIRECT in the last 72 hours. Thyroid Function Tests: No results for input(s): TSH, T4TOTAL, FREET4, T3FREE, THYROIDAB in the last 72 hours. Anemia Panel:  Recent Labs  11/12/15 1905  VITAMINB12 396  FERRITIN 10*  TIBC 344  IRON 12*   Urine analysis:    Component Value Date/Time   COLORURINE YELLOW 04/18/2014 [place] 04/18/2014 1155   LABSPEC 1.016 04/18/2014 1155   PHURINE 7.0 04/18/2014 1155   GLUCOSEU NEGATIVE 04/18/2014 1155   HGBUR SMALL* 04/18/2014 [place] 04/18/2014 1155   KETONESUR NEGATIVE 04/18/2014 1155   PROTEINUR NEGATIVE 04/18/2014 1155   UROBILINOGEN 1.0 04/18/2014 1155   NITRITE NEGATIVE 04/18/2014 1155   LEUKOCYTESUR TRACE* 04/18/2014 1155   Sepsis Labs: Invalid input(s): PROCALCITONIN, LACTICIDVEN  No results found for this or any previous visit (from the past 240 hour(s)).    Radiology Studies: No results found.   Scheduled Meds: . [MAR Hold] metoprolol tartrate  25 mg Oral BID  . [MAR Hold] pantoprazole  40 mg Oral Daily  . [MAR Hold] sertraline  25 mg Oral Daily  . [MAR Hold] simvastatin  20 mg Oral q1800  . [MAR Hold] sodium chloride flush  3 mL Intravenous Q12H  . [MAR Hold] tamsulosin  0.4 mg Oral QPC breakfast   Continuous Infusions: . sodium chloride       Marzetta Board, MD, PhD [facility] Pager [phone]  If 7PM-7AM, please contact night-coverage [email or web address] Password  [facility] 11/15/2015, 11:36 AM

## 2015-11-15 NOTE — H&P (View-Only) (Signed)
[facility] Consult: 10:57 AM 11/13/2015     Referring Provider: Dr Cruzita Lederer  Primary Care Physician:  Elsie Stain, MD Primary Gastroenterologist:  Originally Dr Velora Heckler. Then several [facility] MDs during inpt stay in 2011.     Reason for Consultation:  Anemia , FOBT +.     HPI: KIEFFER ADI is a 82 y.o. male.   Chronic eliquis for a fib since diagnosis in summer 2017.  EF 04/2015 was 55 to 60% with mild to moderate valvular regurge.  Remote TIA 1980s.  CKD stage 3.    Lower Gi bleed in 2011, presumed diverticular source. transfeused 4 PRBCs then. 11/2009 Capsule endoscopy.  Dr Fuller Plan.  Good study and prep.  Small duodenal AVM. Smooth, large, whitish protruding lesion at 1 hr and 48 minutes ? Lymphoid but larger than what is usually seen, no evidence of ulceration. 11/2009 EGD.  Dr Henrene Pastor.  For hematochezia and r/o UGI source.  Normal EGD.   11/2009 Nuc Med RBC scan.  Negative for active bleeding.  10/2009 Colonoscopy: for hematochezia.  Dr Benson Norway. Diverticulosis, cecal polyp (tubular adenoma), hemorrhoids.   Several months of progressive fatigue and DOE, some increase in chronic cough. In last 10 days, cough is "nasty"  With yellow/thick sputum. Has some chest pressure with increased exertion and when he gets stressed out.  But with slow/steady activity no chest pain.  Just tires out very easily.  Cares for invalid, bed bound, demented wife at home. Dtr in law who does his laundry will see blood staining on his underwear infrequently.  However pt himself says he has seen no BPR and stools are brown, mostly occur every day. He was FOBT negative in 03/2015. Appetite fair.  No dysphagia, no heartburn.   Weight down ~ 20 # in last 5 years.  Called PMD yesterday due to the increased cough and fatigue, sent to ED.  Hgb is  9.4, was ~ 13 in 03/2015.  MCV 81, was 88 in 03/2015.  Iron and iron sats depressed.  Ferritin 10. B12 WNL.   GFR is 39: CKD stage 3.  Troponins 0.11, 0.13 x 2.  BNP elevated at 742.   CXR with hyperinflated lungs.    This AM had short run of wide QRS complex.   Past Medical History  Diagnosis Date  . history of mini stroke 2    Dr. Earley Favor  . Diverticulitis of colon with bleeding 03/11    Hospital 03/25-03/29/11, ARF due dehydr, acute blood loss anemia due divertic bleed  . Lower GI bleed 04/11     acute blood loss anemia, transfusion 4 units PRBC's, non stemi  . History of CT scan of abdomen 11/29/09    No acute process, large R parapelvic renal cyst calcull  . History of ETT 11/24/09    myoview, nml EF 48%  . B12 deficiency   . CKD (chronic kidney disease)     stage 3 in 03/2015  . Atrial fibrillation [facility]) summer 2017    Eliquis initiated.     Past Surgical History  Procedure Laterality Date  . Tracheostomy  7 yoa    Strep throat  . Septoplasty  1990    Dr. Ernesto Rutherford, left  . Cataract extraction, bilateral  09/1997- 10/1997    Prior to Admission medications   Medication Sig Start Date End Date Taking? Authorizing Provider  ELIQUIS 5 MG TABS tablet TAKE 1 TABLET (5 MG TOTAL) BY MOUTH 2 (TWO) TIMES DAILY. 10/07/15  Yes Minna Merritts, MD  pantoprazole (PROTONIX) 40 MG tablet TAKE 1 TABLET (40 MG TOTAL) BY MOUTH DAILY. 11/11/15  Yes Tonia Ghent, MD  sertraline (ZOLOFT) 25 MG tablet Take 1 tablet (25 mg total) by mouth daily. 02/22/15  Yes Tonia Ghent, MD  simvastatin (ZOCOR) 20 MG tablet TAKE 1 TABLET (20 MG TOTAL) BY MOUTH AT BEDTIME. 10/21/15  Yes Tonia Ghent, MD  tamsulosin (FLOMAX) 0.4 MG CAPS capsule TAKE 1 CAPSULE (0.4 MG TOTAL) BY MOUTH DAILY AFTER BREAKFAST. 06/10/15  Yes Tonia Ghent, MD    Scheduled Meds: . pantoprazole  40 mg Oral Daily  . sertraline  25 mg Oral Daily  . simvastatin  20 mg Oral q1800  . sodium chloride flush  3 mL Intravenous  Q12H  . tamsulosin  0.4 mg Oral QPC breakfast   Infusions: . sodium chloride 50 mL/hr at 11/12/15 2045   PRN Meds: acetaminophen **OR** acetaminophen, albuterol, HYDROcodone-acetaminophen, ondansetron **OR** ondansetron (ZOFRAN) IV, senna-docusate   Allergies as of 11/12/2015 - Review Complete 11/12/2015  Allergen Reaction Noted  . Lisinopril Other (See Comments) 04/16/2011  . Simvastatin Other (See Comments) 01/22/2015  . Sulfadiazine Other (See Comments)     Family History  Problem Relation Age of Onset  . Heart disease Father     CHF  . Heart disease Paternal Grandfather     heart attacks    Social History   Social History  . Marital Status: Married    Spouse Name: N/A  . Number of Children: 2  . Years of Education: N/A   Occupational History  . Retired Education officer, [employer]    Social History Main Topics  . Smoking status: Never Smoker   . Smokeless tobacco: Never Used  . Alcohol Use: No  . Drug Use: No  . Sexual Activity: Not on file   Other Topics Concern  . Not on file   Social History Narrative   Wife on hospice as of 2015    REVIEW OF SYSTEMS: Constitutional:  Per HPI ENT:  No nose bleeds Pulm:  Per HPI CV:  Chest pain per HPI, no LE edema.  GU:  No hematuria, no frequency GI:  Per HPI Heme:  No unusual bleeding.  Does bruise easily but no large hematomas.  Last B12 shot was 08/2015 and Dr Damita Dunnings has been giving shots less frequently in the last year, pt says MD says he does not need them as often.    Transfusions:  Per HPI Neuro:  No headaches.  + loss sensation in both feet Derm:  No itching, no rash or sores.  Endocrine:  No sweats or chills.  No polyuria or dysuria Immunization:  Reviewed.   Travel:  None beyond local counties in last few months.    PHYSICAL EXAM: Vital signs in last 24 hours: Filed Vitals:   11/13/15 0200 11/13/15 0514  BP: 115/61 108/65  Pulse:  95  Temp:  98.7 F (37.1 C)  Resp:  20   Wt Readings  from Last 3 Encounters:  05/30/15 65.545 kg (  144 lb 8 oz)  04/16/15 66.906 kg (147 lb 8 oz)  04/02/15 67.473 kg (148 lb 12 oz)    General: pleasant, alert, somewhat frail but does not look  ill Head:  No asymmetry or swelling  Eyes:  No icterus.  Conjunctiva moderately pale Ears:  HOH  Nose:  No discharge Mouth:  Many teeth gone but remainder not in poor shape.  Neck:  No mass, no JVD, no TMG Lungs:  Clear bil.  + cough.  No SOB with speaking Heart: Irreg, irreg.  Rate not accelerated.  Abdomen:  Soft, NT, ND.  Not obese.  No mass, no HSM, no hernias.  BS active.   Rectal: deferred   Musc/Skeltl: no joint redness or swelling.  Some arthritic changes in hands.  Kyphosis.  Extremities:  No CCE  Neurologic:  Oriented x 3.  HOH.  No tremor.   No limb weakness Skin:  No telangectasia, rash or sores.   Tattoos:  none Nodes:  No cervical or inguinal adenopathy   Psych:  Friendly,cooperative.  In good spirits.   Intake/Output from previous day: 03/21 0701 - 03/22 0700 In: 702.5 [P.O.:240; I.V.:462.5] Out: 525 [Urine:525] Intake/Output this shift:    LAB RESULTS:  Recent Labs  11/12/15 1128  WBC 8.4  HGB 9.4*  HCT 31.8*  PLT 244   BMET Lab Results  Component Value Date   NA 142 11/12/2015   NA 140 04/16/2015   NA 141 03/26/2015   K 4.9 11/12/2015   K 4.2 04/16/2015   K 4.4 03/26/2015   CL 106 11/12/2015   CL 105 04/16/2015   CL 105 03/26/2015   CO2 26 11/12/2015   CO2 27 04/16/2015   CO2 32 03/26/2015   GLUCOSE 123* 11/12/2015   GLUCOSE 80 04/16/2015   GLUCOSE 89 03/26/2015   BUN 31* 11/12/2015   BUN 24* 04/16/2015   BUN 27* 03/26/2015   CREATININE 1.52* 11/12/2015   CREATININE 1.30 04/16/2015   CREATININE 1.49 03/26/2015   CALCIUM 9.6 11/12/2015   CALCIUM 9.6 04/16/2015   CALCIUM 9.8 03/26/2015   LFT No results for input(s): PROT, ALBUMIN, AST, ALT, ALKPHOS, BILITOT, BILIDIR, IBILI in the last 72 hours. PT/INR Lab Results  Component Value Date   INR  1.67* 11/12/2015   INR 1.14 11/21/2009   INR 1.21 11/15/2009   Hepatitis Panel No results for input(s): HEPBSAG, HCVAB, HEPAIGM, HEPBIGM in the last 72 hours. C-Diff No components found for: CDIFF Lipase     Component Value Date/Time   LIPASE 37 04/18/2014 1045    Drugs of Abuse  No results found for: LABOPIA, COCAINSCRNUR, LABBENZ, AMPHETMU, THCU, LABBARB   RADIOLOGY STUDIES: Dg Chest 2 View  11/12/2015  CLINICAL DATA:  Short of breath and fatigue. EXAM: CHEST  2 VIEW COMPARISON:  11/15/2009 FINDINGS: Normal cardiac silhouette. Lungs are hyperinflated. No effusion, infiltrate or pneumothorax. Calcification of the thoracic aorta. IMPRESSION: Hyperinflated lungs without acute findings. Electronically Signed   By: Suzy Bouchard M.D.   On: 11/12/2015 12:41    ENDOSCOPIC STUDIES: Per HPI   IMPRESSION:   *  Anemia, FOBT +.  Iron deficient.   Hx diverticular bleed in 2011.  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  Suspect this along with the Afib are responsible for his fatigue and exhaustion.   *  Eliquis for a fib.   *  Hx GERD, remote EGD in 1990s with dilation of esoph stricture.  Dysphagia is not a problem for him.  Daily  PPI at home.   *  Stage 3 CKD.  May be contributing to anemia.     PLAN:     *  Per Dr Fuller Plan.    *  Will ask pharmacy to dose parenteral iron. At his age, he may not absorb po iron that well.     Azucena Freed  11/13/2015, 10:57 AM Pager: [phone]      Attending physician's note   I have taken a history, examined the patient and reviewed the chart. I agree with the Advanced Practitioner's note, impression and recommendations. Iron deficiency and heme positive stool on Eliquis. Likely has chronic GI losses from known SB AVMs found on GI evaluation in 2011. Recommended colonoscopy and EGD given that is has been 6 years since prior GI work up. If primary service plans for further evaluation of chest pain will wait until that evaluation is completed. IV  FE to rapidly replace Fe stores. Hold Eliquis for possible colonoscopy and EGD.   Lucio Edward, MD Mar[facility] Regal [phone] Mon-Fri 8a-5p [phone] after 5p, weekends, holidays

## 2015-11-15 NOTE — Op Note (Signed)
[facility] Patient Name: Ronnie Chan Procedure Date : 11/15/2015 MRN: [id number] Attending MD: Ladene Artist , MD Date of Birth: 04/20/1925 CSN: [id number] Age: 82 Admit Type: Inpatient Procedure:                Colonoscopy Indications:              Heme positive stool, Unexplained iron deficiency                            anemia Providers:                Pricilla Riffle. Fuller Plan, MD, Kingsley Plan, RN, Cletis Athens, Technician, Ralene Bathe, Technician Referring MD:             [facility] Medicines:                Fentanyl 25 micrograms IV, Midazolam 3 mg IV Complications:            No immediate complications. Estimated Blood Loss:     Estimated blood loss was minimal. Procedure:                Pre-Anesthesia Assessment:                           - Prior to the procedure, a History and Physical                            was performed, and patient medications and                            allergies were reviewed. The patient's tolerance of                            previous anesthesia was also reviewed. The risks                            and benefits of the procedure and the sedation                            options and risks were discussed with the patient.                            All questions were answered, and informed consent                            was obtained. Prior Anticoagulants: The patient has                            taken Eliquis (apixaban), last dose was 2 days                            prior to procedure. ASA Grade Assessment: III - A  patient with severe systemic disease. After                            reviewing the risks and benefits, the patient was                            deemed in satisfactory condition to undergo the                            procedure.                           After obtaining informed consent, the colonoscope                            was passed  under direct vision. Throughout the                            procedure, the patient's blood pressure, pulse, and                            oxygen saturations were monitored continuously. The                            [id number] scope was introduced through                            the anus and advanced to the the cecum, identified                            by appendiceal orifice and ileocecal valve. The                            colonoscopy was performed without difficulty. The                            patient tolerated the procedure well. The quality                            of the bowel preparation was good. The ileocecal                            valve, appendiceal orifice, and rectum were                            photographed. Scope In: 11:34:26 AM Scope Out: 11:51:55 AM Scope Withdrawal Time: 0 hours 10 minutes 12 seconds  Total Procedure Duration: 0 hours 17 minutes 29 seconds  Findings:      The perianal and digital rectal examinations were normal.      A 8 mm polyp was found in the cecum. The polyp was semi-pedunculated.       The polyp was removed with a cold snare. Resection and retrieval were       complete.      One small localized angioectasia without bleeding was  found in the cecum.      Multiple medium-mouthed diverticula were found in the sigmoid colon,       descending colon and transverse colon. There was no evidence of       diverticular bleeding.      The exam was otherwise without abnormality.      Internal hemorrhoids were found during retroflexion. The hemorrhoids       were small and Grade I (internal hemorrhoids that do not prolapse). Impression:               - One 8 mm polyp in the cecum, removed with a cold                            snare. Resected and retrieved.                           - One non-bleeding colonic angioectasia in the                            cecum.                           - Moderate diverticulosis in the sigmoid  colon, in                            the descending colon and in the transverse colon.                            There was no evidence of diverticular bleeding.                           - The examination was otherwise normal.                           - Internal hemorrhoids. Moderate Sedation:      Moderate (conscious) sedation was administered by the endoscopy nurse       and supervised by the endoscopist. The following parameters were       monitored: oxygen saturation, heart rate, blood pressure, respiratory       rate, EKG, adequacy of pulmonary ventilation, and response to care.       Total physician intraservice time was 26 minutes. Recommendation:           - Return patient to hospital ward for ongoing care.                           - Resume previous diet.                           - Continue present medications.                           - Await pathology results.                           - Resume Eliquis (apixaban) at prior dose in 4  days. Refer to referring physician for further                            adjustment of therapy.                           - Avoid ASA/NSAIDs long term                           - No plans for future screening or surviellance                            colonoscopy due to age                           - No repeat colonoscopy due to age. Procedure Code(s):        --- Professional ---                           [phone], Colonoscopy, flexible; with removal of                            tumor(s), polyp(s), or other lesion(s) by snare                            technique                           99153, Moderate sedation services; each additional                            15 minutes intraservice time                           G0500, Moderate sedation services provided by the                            same physician or other qualified health care                            professional performing a gastrointestinal                             endoscopic service that sedation supports,                            requiring the presence of an independent trained                            observer to assist in the monitoring of the                            patient's level of consciousness and physiological                            status; initial 15 minutes of  intra-service time;                            patient age 36 years or older (additional time may                            be reported with [phone], as appropriate) Diagnosis Code(s):        --- Professional ---                           D12.0, Benign neoplasm of cecum                           K55.20, Angiodysplasia of colon without hemorrhage                           K64.0, First degree hemorrhoids                           R19.5, Other fecal abnormalities                           D50.9, Iron deficiency anemia, unspecified                           K57.30, Diverticulosis of large intestine without                            perforation or abscess without bleeding CPT copyright 2016 American Medical Association. All rights reserved. The codes documented in this report are preliminary and upon coder review may  be revised to meet current compliance requirements. Ladene Artist, MD 11/15/2015 12:02:09 PM This report has been signed electronically. Number of Addenda: 0

## 2015-11-15 NOTE — Op Note (Signed)
[facility] Patient Name: Franco Edmundson Procedure Date : 11/15/2015 MRN: [id number] Attending MD: Ladene Artist , MD Date of Birth: July 08, 1925 CSN: [id number] Age: 82 Admit Type: Inpatient Procedure:                Upper GI endoscopy Indications:              Unexplained iron deficiency anemia, Heme positive                            stool Providers:                Pricilla Riffle. Fuller Plan, MD, Kingsley Plan, RN, Cletis Athens, Technician, Ralene Bathe, Technician Referring MD:             [facility] Medicines:                Midazolam 0.5 mg IV, Cetacaine spray Complications:            No immediate complications. Estimated Blood Loss:     Estimated blood loss was minimal. Procedure:                Pre-Anesthesia Assessment:                           - Prior to the procedure, a History and Physical                            was performed, and patient medications and                            allergies were reviewed. The patient's tolerance of                            previous anesthesia was also reviewed. The risks                            and benefits of the procedure and the sedation                            options and risks were discussed with the patient.                            All questions were answered, and informed consent                            was obtained. Prior Anticoagulants: The patient has                            taken Eliquis (apixaban), last dose was 2 days                            prior to procedure. ASA Grade Assessment: III - A  patient with severe systemic disease. After                            reviewing the risks and benefits, the patient was                            deemed in satisfactory condition to undergo the                            procedure.                           - Prior to the procedure, a History and Physical                            was performed,  and patient medications and                            allergies were reviewed. The patient's tolerance of                            previous anesthesia was also reviewed. The risks                            and benefits of the procedure and the sedation                            options and risks were discussed with the patient.                            All questions were answered, and informed consent                            was obtained. Prior Anticoagulants: The patient has                            taken Eliquis (apixaban), last dose was 2 days                            prior to procedure. ASA Grade Assessment: III - A                            patient with severe systemic disease. After                            reviewing the risks and benefits, the patient was                            deemed in satisfactory condition to undergo the                            procedure.  After obtaining informed consent, the endoscope was                            passed under direct vision. Throughout the                            procedure, the patient's blood pressure, pulse, and                            oxygen saturations were monitored continuously. The                            [id number]) scope was introduced through the                            mouth, and advanced to the third part of duodenum.                            The upper GI endoscopy was accomplished without                            difficulty. The patient tolerated the procedure                            well. Scope In: Scope Out: Findings:      Three 4 mm semi-sessile polyps with no bleeding and no stigmata of       recent bleeding were found in the gastric fundus. Biopsies were taken       with a cold forceps for histology. Estimated blood loss was minimal.      The exam of the stomach was otherwise normal.      The cardia and gastric fundus were otherwise normal on  retroflexion.      The examined esophagus was normal.      The duodenal bulb, second portion of the duodenum and third portion of       the duodenum were normal. Impression:               - Three gastric polyps. Biopsied.                           - Otherwise normal EGD Moderate Sedation:      Moderate (conscious) sedation was administered by the endoscopy nurse       and supervised by the endoscopist. The following parameters were       monitored: oxygen saturation, heart rate, blood pressure, respiratory       rate, EKG, adequacy of pulmonary ventilation, and response to care.       Total physician intraservice time was 14 minutes. Recommendation:           - Return patient to hospital ward for ongoing care.                           - Resume previous diet.                           - Continue present medications.                           -  Resume Eliquis (apixaban) at prior dose in 4 days.                           - Await pathology results.                           - No aspirin, ibuprofen, naproxen, or other                            non-steroidal anti-inflammatory drugs. Procedure Code(s):        --- Professional ---                           [phone], Esophagogastroduodenoscopy, flexible,                            transoral; with biopsy, single or multiple                           99152, Moderate sedation services provided by the                            same physician or other qualified health care                            professional performing the diagnostic or                            therapeutic service that the sedation supports,                            requiring the presence of an independent trained                            observer to assist in the monitoring of the                            patient's level of consciousness and physiological                            status; initial 15 minutes of intraservice time,                            patient age 80  years or older Diagnosis Code(s):        --- Professional ---                           K31.7, Polyp of stomach and duodenum                           D50.9, Iron deficiency anemia, unspecified                           R19.5, Other fecal abnormalities CPT copyright 2016 American Medical Association. All rights reserved. The codes documented in this report are preliminary  and upon coder review may  be revised to meet current compliance requirements. Ladene Artist, MD 11/15/2015 12:15:16 PM This report has been signed electronically. Number of Addenda: 0

## 2015-11-15 NOTE — Interval H&P Note (Signed)
History and Physical Interval Note:  11/15/2015 11:24 AM  Ronnie Chan  has presented today for surgery, with the diagnosis of FOBT +, anemia.  The various methods of treatment have been discussed with the patient and family. After consideration of risks, benefits and other options for treatment, the patient has consented to  Procedure(s): ESOPHAGOGASTRODUODENOSCOPY (EGD) (N/A) COLONOSCOPY (N/A) as a surgical intervention .  The patient's history has been reviewed, patient examined, no change in status, stable for surgery.  I have reviewed the patient's chart and labs.  Questions were answered to the patient's satisfaction.     Pricilla Riffle. Fuller Plan

## 2015-11-16 DIAGNOSIS — R079 Chest pain, unspecified: Secondary | ICD-10-CM | POA: Diagnosis not present

## 2015-11-16 DIAGNOSIS — D509 Iron deficiency anemia, unspecified: Secondary | ICD-10-CM | POA: Diagnosis not present

## 2015-11-16 DIAGNOSIS — Z7901 Long term (current) use of anticoagulants: Secondary | ICD-10-CM | POA: Diagnosis not present

## 2015-11-16 DIAGNOSIS — R195 Other fecal abnormalities: Secondary | ICD-10-CM | POA: Diagnosis not present

## 2015-11-16 DIAGNOSIS — I481 Persistent atrial fibrillation: Secondary | ICD-10-CM | POA: Diagnosis not present

## 2015-11-16 LAB — BASIC METABOLIC PANEL
Anion gap: 8 (ref 5–15)
BUN: 32 mg/dL — AB (ref 6–20)
CALCIUM: 8.7 mg/dL — AB (ref 8.9–10.3)
CO2: 23 mmol/L (ref 22–32)
CREATININE: 1.62 mg/dL — AB (ref 0.61–1.24)
Chloride: 109 mmol/L (ref 101–111)
GFR, EST AFRICAN AMERICAN: 42 mL/min — AB (ref >60)
GFR, EST NON AFRICAN AMERICAN: 36 mL/min — AB (ref >60)
Glucose, Bld: 94 mg/dL (ref 65–99)
POTASSIUM: 4 mmol/L (ref 3.5–5.1)
SODIUM: 140 mmol/L (ref 135–145)

## 2015-11-16 MED ORDER — METOPROLOL TARTRATE 25 MG PO TABS: 25.0000 mg | ORAL_TABLET | Freq: Two times a day (BID) | ORAL | Status: DC

## 2015-11-16 NOTE — Discharge Instructions (Signed)
Follow with Elsie Stain, MD in 5-7 days  Hold Eliquis for 4 days until 3/29  Please get a complete blood count and chemistry panel checked by your Primary MD at your next visit, and again as instructed by your Primary MD. Please get your medications reviewed and adjusted by your Primary MD.  Please request your Primary MD to go over all Hospital Tests and Procedure/Radiological results at the follow up, please get all Hospital records sent to your Prim MD by signing hospital release before you go home.  If you had Pneumonia of Lung problems at the Hospital: Please get a 2 view Chest X ray done in 6-8 weeks after hospital discharge or sooner if instructed by your Primary MD.  If you have Congestive Heart Failure: Please call your Cardiologist or Primary MD anytime you have any of the following symptoms:  1) 3 pound weight gain in 24 hours or 5 pounds in 1 week  2) shortness of breath, with or without a dry hacking cough  3) swelling in the hands, feet or stomach  4) if you have to sleep on extra pillows at night in order to breathe  Follow cardiac low salt diet and 1.5 lit/day fluid restriction.  If you have diabetes Accuchecks 4 times/day, Once in AM empty stomach and then before each meal. Log in all results and show them to your primary doctor at your next visit. If any glucose reading is under 80 or above 300 call your primary MD immediately.  If you have Seizure/Convulsions/Epilepsy: Please do not drive, operate heavy machinery, participate in activities at heights or participate in high speed sports until you have seen by Primary MD or a Neurologist and advised to do so again.  If you had Gastrointestinal Bleeding: Please ask your Primary MD to check a complete blood count within one week of discharge or at your next visit. Your endoscopic/colonoscopic biopsies that are pending at the time of discharge, will also need to followed by your Primary MD.  Get Medicines reviewed and  adjusted. Please take all your medications with you for your next visit with your Primary MD  Please request your Primary MD to go over all hospital tests and procedure/radiological results at the follow up, please ask your Primary MD to get all Hospital records sent to his/her office.  If you experience worsening of your admission symptoms, develop shortness of breath, life threatening emergency, suicidal or homicidal thoughts you must seek medical attention immediately by calling 911 or calling your MD immediately  if symptoms less severe.  You must read complete instructions/literature along with all the possible adverse reactions/side effects for all the Medicines you take and that have been prescribed to you. Take any new Medicines after you have completely understood and accpet all the possible adverse reactions/side effects.   Do not drive or operate heavy machinery when taking Pain medications.   Do not take more than prescribed Pain, Sleep and Anxiety Medications  Special Instructions: If you have smoked or chewed Tobacco  in the last 2 yrs please stop smoking, stop any regular Alcohol  and or any Recreational drug use.  Wear Seat belts while driving.  Please note You were cared for by a hospitalist during your hospital stay. If you have any questions about your discharge medications or the care you received while you were in the hospital after you are discharged, you can call the unit and asked to speak with the hospitalist on call if the hospitalist that  took care of you is not available. Once you are discharged, your primary care physician will handle any further medical issues. Please note that NO REFILLS for any discharge medications will be authorized once you are discharged, as it is imperative that you return to your primary care physician (or establish a relationship with a primary care physician if you do not have one) for your aftercare needs so that they can reassess your need  for medications and monitor your lab values.  You can reach the hospitalist office at phone [phone] or fax [phone]   If you do not have a primary care physician, you can call [phone] for a physician referral.  Activity: As tolerated with Full fall precautions use walker/cane & assistance as needed  Diet: heart healthy  Disposition Home

## 2015-11-16 NOTE — Discharge Summary (Signed)
Physician Discharge Summary  Ronnie Chan [id number] DOB: [birth year] DOA: 11/12/2015  PCP: Elsie Stain, MD  Admit date: 11/12/2015 Discharge date: 11/16/2015  Time spent: > 30 minutes  Recommendations for Outpatient Follow-up:  1. Follow up with Dr. Damita Dunnings as scheduled 2. Follow up with Cardiology as outpatient  3. CBC at next visit  Discharge Diagnoses:  Active Problems:   Atrial fibrillation (HCC)   Anemia   Absolute anemia   Pain in the chest   Bleeding gastrointestinal   Anemia, iron deficiency   Occult blood in stools   Chronic anticoagulation  Discharge Condition: stable  Diet recommendation: heart healthy  There were no vitals filed for this visit.  History of present illness:  See H&P, Labs, Consult and Test reports for all details in brief, patient is a 82 y.o. male with a Past Medical History of TIA, diverticulitis, GI bleed who presents with CP, chronic cough, worsening anemia. Found to have positive FOBT and slight troponin elevation.  Hospital Course:  Chest pain - with typical and atypical components, slight troponin elevation likely due to demand ischemia from anemia. Cardiology consulted, appreciate input. No further evaluation, continue medical management. Heart rate control for his A. fib, otherwise no other investigations planned per cardiology Acute blood loss anemia with underlying GI bleeding - positive fecal occult, patient denies any bleeding but the son who is bedside states that occasionally family would notice blood on patient's underpants occasionally. Consulted GI, patient underwent a colonoscopy and EGD 3/24 as below. S/p IV iron A fib - on BB, rate controlled. Eliquis on hold for 4 days per GI recommendations. Outpatient follow up with PCP and cardiology, repeat CBC in 1 week Chronic cough - CXR clear, perhaps chronic bronchitis AKI on CKD stage 3 - renal function overall stable, continue to monitor as an outpatient Hyperlipidemia -  continue home statin GERD - continue home PPI Depression - Stable, continue zoloft   Procedures:  EGD Impression:  - Three gastric polyps. Biopsied. - Otherwise normal EGD  Colonoscopy  Impression:  - One 8 mm polyp in the cecum, removed with a cold snare. Resected and retrieved. 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 - Moderate diverticulosis in the sigmoid colon, in the descending colon and in the transverse colon. There was no evidence of diverticular bleeding. - The examination was otherwise normal. - Internal hemorrhoids.  Consultations:  Cardiology   GI  Discharge Exam: Filed Vitals:   11/15/15 1255 11/15/15 1400 11/15/15 2006 11/16/15 0352  BP:  117/50 100/41 102/50  Pulse: 71 70 67 68  Temp:   97.7 F (36.5 C) 98 F (36.7 C)  TempSrc:   Oral Oral  Resp: 19  18 18   SpO2: 96% 97% 96% 97%    General: NAD Cardiovascular: RRR Respiratory: CTA biL  Discharge Instructions Activity:  As tolerated   Get Medicines reviewed and adjusted: Please take all your medications with you for your next visit with your Primary MD  Please request your Primary MD to go over all hospital tests and procedure/radiological results at the follow up, please ask your Primary MD to get all Hospital records sent to his/her office.  If you experience worsening of your admission symptoms, develop shortness of breath, life threatening emergency, suicidal or homicidal thoughts you must seek medical attention immediately by calling 911 or calling your MD immediately if symptoms less severe.  You must read complete instructions/literature along with all the possible adverse reactions/side effects for all the Medicines  you take and that have been prescribed to you. Take any new Medicines after you have completely understood and accpet all the possible adverse reactions/side effects.   Do not drive when taking Pain medications.   Do not take more  than prescribed Pain, Sleep and Anxiety Medications  Special Instructions: If you have smoked or chewed Tobacco in the last 2 yrs please stop smoking, stop any regular Alcohol and or any Recreational drug use.  Wear Seat belts while driving.  Please note  You were cared for by a hospitalist during your hospital stay. Once you are discharged, your primary care physician will handle any further medical issues. Please note that NO REFILLS for any discharge medications will be authorized once you are discharged, as it is imperative that you return to your primary care physician (or establish a relationship with a primary care physician if you do not have one) for your aftercare needs so that they can reassess your need for medications and monitor your lab values.    Medication List    TAKE these medications        ELIQUIS 5 MG Tabs tablet  Generic drug:  apixaban  TAKE 1 TABLET (5 MG TOTAL) BY MOUTH 2 (TWO) TIMES DAILY.     metoprolol tartrate 25 MG tablet  Commonly known as:  LOPRESSOR  Take 1 tablet (25 mg total) by mouth 2 (two) times daily.     pantoprazole 40 MG tablet  Commonly known as:  PROTONIX  TAKE 1 TABLET (40 MG TOTAL) BY MOUTH DAILY.     sertraline 25 MG tablet  Commonly known as:  ZOLOFT  Take 1 tablet (25 mg total) by mouth daily.     simvastatin 20 MG tablet  Commonly known as:  ZOCOR  TAKE 1 TABLET (20 MG TOTAL) BY MOUTH AT BEDTIME.     tamsulosin 0.4 MG Caps capsule  Commonly known as:  FLOMAX  TAKE 1 CAPSULE (0.4 MG TOTAL) BY MOUTH DAILY AFTER BREAKFAST.           Follow-up Information    Follow up with Elsie Stain, MD. Schedule an appointment as soon as possible for a visit in 2 weeks.   Specialty:  Family Medicine   Contact information:   [place] [address] [phone]       Follow up with Kathlyn Sacramento, MD. Schedule an appointment as soon as possible for a visit in 2 weeks.   Specialty:  Cardiology   Contact  information:   [place]  [id number] [phone]       The results of significant diagnostics from this hospitalization (including imaging, microbiology, ancillary and laboratory) are listed below for reference.    Significant Diagnostic Studies: Dg Chest 2 View  11/12/2015  CLINICAL DATA:  Short of breath and fatigue. EXAM: CHEST  2 VIEW COMPARISON:  11/15/2009 FINDINGS: Normal cardiac silhouette. Lungs are hyperinflated. No effusion, infiltrate or pneumothorax. Calcification of the thoracic aorta. IMPRESSION: Hyperinflated lungs without acute findings. Electronically Signed   By: Suzy Bouchard M.D.   On: 11/12/2015 12:41    Microbiology: No results found for this or any previous visit (from the past 240 hour(s)).   Labs: Basic Metabolic Panel:  Recent Labs Lab 11/12/15 1128 11/12/15 2220 11/14/15 0330 11/14/15 1415 11/15/15 0502 11/16/15 0346  NA 142  --  137  --  142 140  K 4.9  --  5.6* 4.8 4.1 4.0  CL 106  --  105  --  111 109  CO2 26  --  24  --  21* 23  GLUCOSE 123*  --  112*  --  87 94  BUN 31*  --  25*  --  29* 32*  CREATININE 1.52*  --  1.53*  --  1.67* 1.62*  CALCIUM 9.6  --  9.1  --  8.8* 8.7*  MG  --  2.0  --   --   --   --    CBC:  Recent Labs Lab 11/12/15 1128 11/12/15 1905 11/14/15 0330 11/15/15 0502  WBC 8.4  --  7.1 8.4  HGB 9.4*  --  9.5* 9.4*  HCT 31.8* 28.5* 32.3* 31.4*  MCV 81.1  --  81.2 80.3  PLT 244  --  243 241   Cardiac Enzymes:  Recent Labs Lab 11/12/15 1646 11/12/15 1905 11/12/15 2220  TROPONINI 0.11* 0.13* 0.13*   BNP: BNP (last 3 results)  Recent Labs  11/12/15 1905 11/13/15 1300  BNP 742.7* 674.8*     Signed:  Marzetta Board  [facility] 11/16/2015, 10:37 AM

## 2015-11-16 NOTE — Progress Notes (Signed)
Discharge instructions reviewed with pt and son.  They both express understanding of follow up appointments, medication regimen as well as info regarding pt's specific healthcare needs.  IV and Tele removed, CCMD notified. Pt discharged in care of son.

## 2015-11-17 ENCOUNTER — Encounter (HOSPITAL_COMMUNITY): Payer: Self-pay | Admitting: Gastroenterology

## 2015-11-18 ENCOUNTER — Encounter: Payer: Self-pay | Admitting: Gastroenterology

## 2015-11-18 ENCOUNTER — Telehealth: Payer: Self-pay | Admitting: *Deleted

## 2015-11-18 NOTE — Telephone Encounter (Signed)
Transition Care Management Follow-up Telephone Call   Date discharged? 11/16/15   How have you been since you were released from the hospital? Still feeling weak, no change.   Do you understand why you were in the hospital? yes   Do you understand the discharge instructions? yes   Where were you discharged to? home   Items Reviewed:  Medications reviewed: yes, off of Eliquis - added metoprolol 25 mg qd  Allergies reviewed: yes  Dietary changes reviewed: no  Referrals reviewed: yes, cardiology - not yet scheduled   Functional Questionnaire:   Activities of Daily Living (ADLs):   He states they are independent in the following: ambulation, bathing and hygiene, feeding, continence, grooming, toileting and dressing States they require assistance with the following: none   Any transportation issues/concerns?: no   Any patient concerns? no   Confirmed importance and date/time of follow-up visits scheduled yes, confirmed with Lattie Haw - 11/26/15 @ 1130  Provider Appointment booked with G. Renford Dills, MD  Confirmed with patient if condition begins to worsen call PCP or go to the ER.  Patient was given the office number and encouraged to call back with question or concerns.  : yes

## 2015-11-20 ENCOUNTER — Telehealth: Payer: Self-pay | Admitting: Family Medicine

## 2015-11-20 DIAGNOSIS — I4891 Unspecified atrial fibrillation: Secondary | ICD-10-CM

## 2015-11-20 NOTE — Telephone Encounter (Signed)
Ronnie Chan called to see if she could get a referral to dr Chancy Milroy cardiology.  Pt sees a cardiologist now but is not happy with him He needs a hospital follow up with cardiology

## 2015-11-21 NOTE — Telephone Encounter (Signed)
Ordered. Thanks

## 2015-11-26 ENCOUNTER — Ambulatory Visit (INDEPENDENT_AMBULATORY_CARE_PROVIDER_SITE_OTHER): Payer: Medicare Other | Admitting: Family Medicine

## 2015-11-26 ENCOUNTER — Encounter: Payer: Self-pay | Admitting: Family Medicine

## 2015-11-26 VITALS — BP 102/50 | HR 64 | Temp 98.3°F | Wt 146.0 lb

## 2015-11-26 DIAGNOSIS — I481 Persistent atrial fibrillation: Secondary | ICD-10-CM

## 2015-11-26 DIAGNOSIS — K5521 Angiodysplasia of colon with hemorrhage: Secondary | ICD-10-CM | POA: Diagnosis not present

## 2015-11-26 DIAGNOSIS — D649 Anemia, unspecified: Secondary | ICD-10-CM | POA: Diagnosis not present

## 2015-11-26 DIAGNOSIS — I4819 Other persistent atrial fibrillation: Secondary | ICD-10-CM

## 2015-11-26 LAB — CBC WITH DIFFERENTIAL/PLATELET
BASOS ABS: 0 10*3/uL (ref 0.0–0.1)
Basophils Relative: 0.5 % (ref 0.0–3.0)
EOS ABS: 0.2 10*3/uL (ref 0.0–0.7)
Eosinophils Relative: 3 % (ref 0.0–5.0)
HCT: 32.8 % — ABNORMAL LOW (ref 39.0–52.0)
HEMOGLOBIN: 10.5 g/dL — AB (ref 13.0–17.0)
Lymphocytes Relative: 11.9 % — ABNORMAL LOW (ref 12.0–46.0)
Lymphs Abs: 0.9 10*3/uL (ref 0.7–4.0)
MCHC: 32.1 g/dL (ref 30.0–36.0)
MCV: 81.3 fl (ref 78.0–100.0)
Monocytes Absolute: 0.6 10*3/uL (ref 0.1–1.0)
Monocytes Relative: 7.8 % (ref 3.0–12.0)
Neutro Abs: 5.8 10*3/uL (ref 1.4–7.7)
Neutrophils Relative %: 76.8 % (ref 43.0–77.0)
Platelets: 223 10*3/uL (ref 150.0–400.0)
RBC: 4.03 Mil/uL — AB (ref 4.22–5.81)
RDW: 20.2 % — ABNORMAL HIGH (ref 11.5–15.5)
WBC: 7.6 10*3/uL (ref 4.0–10.5)

## 2015-11-26 LAB — BASIC METABOLIC PANEL
BUN: 23 mg/dL (ref 6–23)
CALCIUM: 9.2 mg/dL (ref 8.4–10.5)
CO2: 30 mEq/L (ref 19–32)
Chloride: 102 mEq/L (ref 96–112)
Creatinine, Ser: 1.4 mg/dL (ref 0.40–1.50)
GFR: 50.7 mL/min — AB (ref >60.00)
GLUCOSE: 84 mg/dL (ref 70–99)
POTASSIUM: 4.8 meq/L (ref 3.5–5.1)
Sodium: 136 mEq/L (ref 135–145)

## 2015-11-26 NOTE — Progress Notes (Signed)
Pre visit review using our clinic review tool, if applicable. No additional management support is needed unless otherwise documented below in the visit note. Admit date: 11/12/2015 Discharge date: 11/16/2015  Time spent: > 30 minutes  Recommendations for Outpatient Follow-up:  1. Follow up with Dr. Damita Dunnings as scheduled 2. Follow up with Cardiology as outpatient  3. CBC at next visit  Discharge Diagnoses:  Active Problems:  Atrial fibrillation (HCC)  Anemia  Absolute anemia  Pain in the chest  Bleeding gastrointestinal  Anemia, iron deficiency  Occult blood in stools  Chronic anticoagulation  Discharge Condition: stable  Diet recommendation: heart healthy  There were no vitals filed for this visit.  History of present illness:  See H&P, Labs, Consult and Test reports for all details in brief, patient is a 82 y.o. male with a Past Medical History of TIA, diverticulitis, GI bleed who presents with CP, chronic cough, worsening anemia. Found to have positive FOBT and slight troponin elevation.  Hospital Course:  Chest pain - with typical and atypical components, slight troponin elevation likely due to demand ischemia from anemia. Cardiology consulted, appreciate input. No further evaluation, continue medical management. Heart rate control for his A. fib, otherwise no other investigations planned per cardiology Acute blood loss anemia with underlying GI bleeding - positive fecal occult, patient denies any bleeding but the son who is bedside states that occasionally family would notice blood on patient's underpants occasionally. Consulted GI, patient underwent a colonoscopy and EGD 3/24 as below. S/p IV iron A fib - on BB, rate controlled. Eliquis on hold for 4 days per GI recommendations. Outpatient follow up with PCP and cardiology, repeat CBC in 1 week Chronic cough - CXR clear, perhaps chronic bronchitis AKI on CKD stage 3 - renal function overall stable, continue to monitor  as an outpatient Hyperlipidemia - continue home statin GERD - continue home PPI Depression - Stable, continue zoloft   Procedures:  EGD Impression:  - Three gastric polyps. Biopsied. - Otherwise normal EGD  Colonoscopy Impression:  - One 8 mm polyp in the cecum, removed with a cold snare. Resected and retrieved. 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 - Moderate diverticulosis in the sigmoid colon, in the descending colon and in the transverse colon. There was no evidence of diverticular bleeding. - The examination was otherwise normal. - Internal hemorrhoids.  Consultations:  Cardiology   GI  The above was d/w pt, re: inpatient course, GI w/u and current meds. He feels better, no CP, is currently off eliquis.  Not yet on iron.  Due for f/u labs.  No SOB.  He is clearly improved from his admission status.  Has seen cards in the meantime- Dr. Humphrey Rolls.  He has a stress test pending.  He has no black or red stools in the interval.    PMH and SH reviewed  ROS: See HPI, otherwise noncontributory.  Meds, vitals, and allergies reviewed.   GEN: nad, alert and oriented HEENT: mucous membranes moist NECK: supple w/o LA CV: IRR, not tachy PULM: ctab, no inc wob ABD: soft, +bs EXT: no edema SKIN: no acute rash

## 2015-11-26 NOTE — Patient Instructions (Signed)
Go to the lab on the way out.  We'll contact you with your lab report. Stay off eliquis for now.  We'll talk to cardiology in the meantime.  I would restart the iron.  You can get it over the counter.   Take care.  Glad to see you.   We'll see about when to recheck you after I see you labs and talk to cardiology.

## 2015-11-28 NOTE — Assessment & Plan Note (Signed)
Per cards.  He is off anticoagulation at this point.  He had colonic angioectasia and noncancerous polyps on endoscopy, which could have caused the bleeding.   At this point, recheck labs and update patient.  I'll be in touch with cards.  Patient agrees with plan.  Restart iron in the meantime.

## 2015-12-01 ENCOUNTER — Other Ambulatory Visit: Payer: Self-pay | Admitting: Family Medicine

## 2015-12-01 DIAGNOSIS — D649 Anemia, unspecified: Secondary | ICD-10-CM

## 2015-12-01 DIAGNOSIS — E7800 Pure hypercholesterolemia, unspecified: Secondary | ICD-10-CM

## 2015-12-03 ENCOUNTER — Other Ambulatory Visit: Payer: Medicare Other

## 2015-12-05 ENCOUNTER — Other Ambulatory Visit: Payer: Self-pay | Admitting: Family Medicine

## 2015-12-05 ENCOUNTER — Telehealth: Payer: Self-pay | Admitting: Family Medicine

## 2015-12-05 NOTE — Telephone Encounter (Signed)
Call from Dr. Humphrey Rolls with cardiology.    Plan from last OV d/w Dr. Humphrey Rolls: He is off anticoagulation at this point.  He had colonic angioectasia and noncancerous polyps on endoscopy, which could have caused the bleeding.  Restart iron in the meantime.   Plan from this point on-  Continue iron.  Recheck ordered labs (cbc, etc). If anemia is improved then can consider adding back 81mg  ASA.  If still passing any blood or if anemia gets worse, then would not be a good candidate for ASA.  I wouldn't start ASA in the meantime, until his labs are done and reviewed.  Would avoid other blood thinners.   Has appointment 12/13/15, okay to do labs before or at that visit.  Thanks.   App cards help.

## 2015-12-05 NOTE — Telephone Encounter (Signed)
Patient advised.  Lab appt scheduled.  

## 2015-12-11 ENCOUNTER — Other Ambulatory Visit: Payer: Medicare Other

## 2015-12-13 ENCOUNTER — Ambulatory Visit (INDEPENDENT_AMBULATORY_CARE_PROVIDER_SITE_OTHER): Payer: Medicare Other | Admitting: Family Medicine

## 2015-12-13 ENCOUNTER — Encounter: Payer: Self-pay | Admitting: Family Medicine

## 2015-12-13 VITALS — BP 118/70 | HR 51 | Temp 97.5°F | Ht 69.0 in | Wt 136.8 lb

## 2015-12-13 DIAGNOSIS — Z636 Dependent relative needing care at home: Secondary | ICD-10-CM | POA: Diagnosis not present

## 2015-12-13 DIAGNOSIS — I4819 Other persistent atrial fibrillation: Secondary | ICD-10-CM

## 2015-12-13 DIAGNOSIS — Z23 Encounter for immunization: Secondary | ICD-10-CM

## 2015-12-13 DIAGNOSIS — Z0000 Encounter for general adult medical examination without abnormal findings: Secondary | ICD-10-CM

## 2015-12-13 DIAGNOSIS — E7800 Pure hypercholesterolemia, unspecified: Secondary | ICD-10-CM

## 2015-12-13 DIAGNOSIS — D649 Anemia, unspecified: Secondary | ICD-10-CM | POA: Diagnosis not present

## 2015-12-13 DIAGNOSIS — Z7189 Other specified counseling: Secondary | ICD-10-CM

## 2015-12-13 LAB — CBC WITH DIFFERENTIAL/PLATELET
BASOS PCT: 0.5 % (ref 0.0–3.0)
Basophils Absolute: 0 10*3/uL (ref 0.0–0.1)
EOS ABS: 0.2 10*3/uL (ref 0.0–0.7)
EOS PCT: 2.9 % (ref 0.0–5.0)
HEMATOCRIT: 40.3 % (ref 39.0–52.0)
HEMOGLOBIN: 13 g/dL (ref 13.0–17.0)
LYMPHS PCT: 14.6 % (ref 12.0–46.0)
Lymphs Abs: 1.1 10*3/uL (ref 0.7–4.0)
MCHC: 32.3 g/dL (ref 30.0–36.0)
MCV: 82.7 fl (ref 78.0–100.0)
MONOS PCT: 7.2 % (ref 3.0–12.0)
Monocytes Absolute: 0.5 10*3/uL (ref 0.1–1.0)
NEUTROS ABS: 5.6 10*3/uL (ref 1.4–7.7)
Neutrophils Relative %: 74.8 % (ref 43.0–77.0)
PLATELETS: 244 10*3/uL (ref 150.0–400.0)
RBC: 4.87 Mil/uL (ref 4.22–5.81)
RDW: 23.4 % — AB (ref 11.5–15.5)
WBC: 7.5 10*3/uL (ref 4.0–10.5)

## 2015-12-13 LAB — COMPREHENSIVE METABOLIC PANEL
ALBUMIN: 4.1 g/dL (ref 3.5–5.2)
ALT: 13 U/L (ref 0–53)
AST: 16 U/L (ref 0–37)
Alkaline Phosphatase: 58 U/L (ref 39–117)
BUN: 29 mg/dL — ABNORMAL HIGH (ref 6–23)
CALCIUM: 10 mg/dL (ref 8.4–10.5)
CHLORIDE: 101 meq/L (ref 96–112)
CO2: 29 meq/L (ref 19–32)
Creatinine, Ser: 1.47 mg/dL (ref 0.40–1.50)
GFR: 47.92 mL/min — AB (ref >60.00)
Glucose, Bld: 110 mg/dL — ABNORMAL HIGH (ref 70–99)
POTASSIUM: 4.6 meq/L (ref 3.5–5.1)
Sodium: 137 mEq/L (ref 135–145)
Total Bilirubin: 1 mg/dL (ref 0.2–1.2)
Total Protein: 7.1 g/dL (ref 6.0–8.3)

## 2015-12-13 LAB — LIPID PANEL
CHOL/HDL RATIO: 3
CHOLESTEROL: 149 mg/dL (ref 0–200)
HDL: 50.4 mg/dL (ref >39.00)
LDL CALC: 84 mg/dL (ref 0–99)
NonHDL: 99.06
TRIGLYCERIDES: 73 mg/dL (ref 0.0–149.0)
VLDL: 14.6 mg/dL (ref 0.0–40.0)

## 2015-12-13 MED ORDER — SERTRALINE HCL 25 MG PO TABS: 25.0000 mg | ORAL_TABLET | Freq: Every day | ORAL | Status: DC

## 2015-12-13 NOTE — Patient Instructions (Addendum)
Go to the lab on the way out.  We'll contact you with your lab report. Call about an eye exam when you can.   Check with your insurance to see if they will cover the shingles shot. I'll await the cardiology notes.  Try to add on an extra snack each day.  Update me as needed.   I would continue the iron for now.

## 2015-12-13 NOTE — Progress Notes (Signed)
Pre visit review using our clinic review tool, if applicable. No additional management support is needed unless otherwise documented below in the visit note.  I have personally reviewed the Medicare Annual Wellness questionnaire and have noted 1. The patient's medical and social history 2. Their use of alcohol, tobacco or illicit drugs 3. Their current medications and supplements 4. The patient's functional ability including ADL's, fall risks, home safety risks and hearing or visual             impairment. 5. Diet and physical activities 6. Evidence for depression or mood disorders  The patients weight, height, BMI have been recorded in the chart and visual acuity is per eye clinic.  I have made referrals, counseling and provided education to the patient based review of the above and I have provided the pt with a written personalized care plan for preventive services.  Provider list updated- see scanned forms.  Routine anticipatory guidance given to patient.  See health maintenance.  Flu done at pharmacy 2016 per patient report Shingles deferred for now, d/w pt.  PNA updated 2017 Tetanus 2004, dw pt Colonoscopy 2017 Prostate cancer screening declined due to age and other illnesses, d/w pt.  Advance directive- son Manford Eckhardt designated if patient were incapacitated.  Cognitive function addressed- see scanned forms- and if abnormal then additional documentation follows.   Anemia with prev GIB, now w/o bleeding, only had black stools after starting iron, due for f/u labs.  Prev d/w pt and cards MD about adding back ASA, not on yet.  No ADE with current meds o/w.  Has f/u with cards pending.    Still with stress of caring for his wife with only about 2 meals per day, as his habit.  D/w pt about adding back a 3rd meal or a heavier snack.  He'll consider.    PMH and SH reviewed  Meds, vitals, and allergies reviewed.   ROS: See HPI.  Otherwise negative.    GEN: nad, alert and  oriented HEENT: mucous membranes moist NECK: supple w/o LA CV: IRR PULM: ctab, no inc wob ABD: soft, +bs EXT: no edema SKIN: no acute rash

## 2015-12-16 ENCOUNTER — Telehealth: Payer: Self-pay | Admitting: Family Medicine

## 2015-12-16 MED ORDER — ASPIRIN EC 81 MG PO TBEC: 81.0000 mg | DELAYED_RELEASE_TABLET | Freq: Every day | ORAL | Status: DC

## 2015-12-16 NOTE — Assessment & Plan Note (Signed)
Resolved, would continue iron, add back ASA 81mg .  Recheck CBC in a few weeks. Okay for outpatient f/u.  See notes on labs.

## 2015-12-16 NOTE — Telephone Encounter (Signed)
Patient returned Lugene's call. °

## 2015-12-16 NOTE — Assessment & Plan Note (Signed)
D/w pt about chronic stressors and diet changes he can make.  We'll follow.

## 2015-12-16 NOTE — Assessment & Plan Note (Signed)
Per cards.  Off other anticoagulation now.  Lipids improved, see notes on labs.

## 2015-12-16 NOTE — Assessment & Plan Note (Signed)
Flu done at pharmacy 2016 per patient report Shingles deferred for now, d/w pt.  PNA updated 2017 Tetanus 2004, dw pt Colonoscopy 2017 Prostate cancer screening declined due to age and other illnesses, d/w pt.  Advance directive- son Kuba Andolina designated if patient were incapacitated.  Cognitive function addressed- see scanned forms- and if abnormal then additional documentation follows.

## 2016-01-13 ENCOUNTER — Other Ambulatory Visit (INDEPENDENT_AMBULATORY_CARE_PROVIDER_SITE_OTHER): Payer: Medicare Other

## 2016-01-13 DIAGNOSIS — D649 Anemia, unspecified: Secondary | ICD-10-CM

## 2016-01-13 LAB — CBC WITH DIFFERENTIAL/PLATELET
BASOS ABS: 0 10*3/uL (ref 0.0–0.1)
Basophils Relative: 0.5 % (ref 0.0–3.0)
EOS ABS: 0.3 10*3/uL (ref 0.0–0.7)
Eosinophils Relative: 3.9 % (ref 0.0–5.0)
HCT: 38.7 % — ABNORMAL LOW (ref 39.0–52.0)
Hemoglobin: 12.5 g/dL — ABNORMAL LOW (ref 13.0–17.0)
LYMPHS ABS: 1.1 10*3/uL (ref 0.7–4.0)
Lymphocytes Relative: 14.4 % (ref 12.0–46.0)
MCHC: 32.4 g/dL (ref 30.0–36.0)
MCV: 85 fl (ref 78.0–100.0)
MONO ABS: 0.5 10*3/uL (ref 0.1–1.0)
Monocytes Relative: 6.5 % (ref 3.0–12.0)
NEUTROS PCT: 74.7 % (ref 43.0–77.0)
Neutro Abs: 5.5 10*3/uL (ref 1.4–7.7)
Platelets: 204 10*3/uL (ref 150.0–400.0)
RBC: 4.55 Mil/uL (ref 4.22–5.81)
RDW: 23.3 % — ABNORMAL HIGH (ref 11.5–15.5)
WBC: 7.4 10*3/uL (ref 4.0–10.5)

## 2016-01-17 ENCOUNTER — Ambulatory Visit (INDEPENDENT_AMBULATORY_CARE_PROVIDER_SITE_OTHER): Payer: Medicare Other | Admitting: Primary Care

## 2016-01-17 ENCOUNTER — Telehealth: Payer: Self-pay | Admitting: Family Medicine

## 2016-01-17 VITALS — BP 122/60 | HR 62 | Temp 97.6°F | Ht 69.0 in | Wt 138.0 lb

## 2016-01-17 DIAGNOSIS — R42 Dizziness and giddiness: Secondary | ICD-10-CM

## 2016-01-17 LAB — COMPREHENSIVE METABOLIC PANEL
ALK PHOS: 49 U/L (ref 39–117)
ALT: 14 U/L (ref 0–53)
AST: 19 U/L (ref 0–37)
Albumin: 3.8 g/dL (ref 3.5–5.2)
BILIRUBIN TOTAL: 0.9 mg/dL (ref 0.2–1.2)
BUN: 28 mg/dL — ABNORMAL HIGH (ref 6–23)
CALCIUM: 9.7 mg/dL (ref 8.4–10.5)
CO2: 29 meq/L (ref 19–32)
CREATININE: 1.35 mg/dL (ref 0.40–1.50)
Chloride: 105 mEq/L (ref 96–112)
GFR: 52.86 mL/min — AB (ref >60.00)
GLUCOSE: 92 mg/dL (ref 70–99)
Potassium: 4.5 mEq/L (ref 3.5–5.1)
Sodium: 138 mEq/L (ref 135–145)
TOTAL PROTEIN: 6.4 g/dL (ref 6.0–8.3)

## 2016-01-17 LAB — POC URINALSYSI DIPSTICK (AUTOMATED)
Bilirubin, UA: NEGATIVE
GLUCOSE UA: NEGATIVE
KETONES UA: NEGATIVE
Leukocytes, UA: NEGATIVE
Nitrite, UA: NEGATIVE
Protein, UA: NEGATIVE
RBC UA: NEGATIVE
SPEC GRAV UA: 1.02
Urobilinogen, UA: NEGATIVE
pH, UA: 6

## 2016-01-17 LAB — TSH: TSH: 2.9 u[IU]/mL (ref 0.35–4.50)

## 2016-01-17 NOTE — Telephone Encounter (Signed)
Pt has appt with Allie Bossier NP on 01/17/16 at 1PM.

## 2016-01-17 NOTE — Patient Instructions (Addendum)
Take your medication as prescribed.  Increase your consumption of water to at least 50 ounces daily.  You are overexerting yourself which is likely causing your symptoms.  Try Meclizine for dizziness. This is a medication used for dizziness and can be purchased over the counter. Take 1/2 tablet by mouth no more than twice daily as needed for dizziness.  Please notify Dr. Damita Dunnings if your symptoms persist.   Complete lab work prior to leaving today. I will notify you of your results once received.   It was a pleasure meeting you!

## 2016-01-17 NOTE — Progress Notes (Signed)
Pre visit review using our clinic review tool, if applicable. No additional management support is needed unless otherwise documented below in the visit note. 

## 2016-01-17 NOTE — Progress Notes (Signed)
Subjective:    Patient ID: Ronnie Chan, male    DOB: 01-08-1925, 82 y.o.   MRN: [id number]  HPI  Ronnie Chan is an 82 year old male with a history of atrial fibrillation and anemia who presents today with a chief complaint of dizziness. His anemia is stable based off of CBC drawn 4 days ago. He is managed on metoprolol tartrate 25 mg BID.   His son called into team health today stating that his father has had episodes of dizziness, feeling off balance, and fatigue that is worse with standing for the past 2 weeks. He had a witnessed syncopal episode last night.   He takes care of his bedridden wife everyday and his son believes he is over exerting himself as he will start to feel tired and dizzy every morning around 10 am after caring for his wife. Yesterday he was taking care of his wife, felt like he was going to pass out, and doesn't remember much. His son caught him and put him in the bed. The patient was out for 15-30 seconds.   His other son is here today and he believes his father has been taking 80 mg of Pantoprazole daily, rather than the prescribed 40 mg.   Today he's feeling shaky and tired. He drinks 32 ounces of water daily. Denies chest pain today, fevers, urinary symptoms. He's been coughing since Winter of 2016 with light sputum, but no worse than usual. He doesn't feel sick. He does follow with cardiology.   Review of Systems  Constitutional: Negative for fever, appetite change and unexpected weight change.  HENT: Negative for congestion and rhinorrhea.   Respiratory: Negative for cough and shortness of breath.   Cardiovascular: Negative for chest pain.  Gastrointestinal: Negative for nausea.  Genitourinary: Negative for dysuria, frequency and difficulty urinating.  Neurological: Positive for dizziness and syncope.       Past Medical History  Diagnosis Date  . history of mini stroke 51    Dr. Earley Favor  . Diverticulitis of colon with bleeding 03/11    Hospital  03/25-03/29/11, ARF due dehydr, acute blood loss anemia due divertic bleed  . Lower GI bleed 04/11     acute blood loss anemia, transfusion 4 units PRBC's, non stemi  . History of CT scan of abdomen 11/29/09    No acute process, large R parapelvic renal cyst calcull  . History of ETT 11/24/09    myoview, nml EF 48%  . B12 deficiency   . CKD (chronic kidney disease)     stage 3 in 03/2015  . Atrial fibrillation [facility]) summer 2017    Eliquis initiated.      Social History   Social History  . Marital Status: Married    Spouse Name: N/A  . Number of Children: 2  . Years of Education: N/A   Occupational History  . Retired Education officer, [employer]    Social History Main Topics  . Smoking status: Never Smoker   . Smokeless tobacco: Never Used  . Alcohol Use: No  . Drug Use: No  . Sexual Activity: Not on file   Other Topics Concern  . Not on file   Social History Narrative   Wife on hospice as of 2015 for dementia, still on home care (off hospice) as of 2017   She is home bound.      Past Surgical History  Procedure Laterality Date  . Tracheostomy  7 yoa    Strep throat  .  Septoplasty  1990    Dr. Ernesto Rutherford, left  . Cataract extraction, bilateral  09/1997- 10/1997  . Esophagogastroduodenoscopy N/A 11/15/2015    Procedure: ESOPHAGOGASTRODUODENOSCOPY (EGD);  Surgeon: Ladene Artist, MD;  Location: [facility] ENDOSCOPY;  Service: Endoscopy;  Laterality: N/A;  . Colonoscopy N/A 11/15/2015    Procedure: COLONOSCOPY;  Surgeon: Ladene Artist, MD;  Location: [facility] ENDOSCOPY;  Service: Endoscopy;  Laterality: N/A;    Family History  Problem Relation Age of Onset  . Heart disease Father     CHF  . Heart disease Paternal Grandfather     heart attacks    Allergies  Allergen Reactions  . Lisinopril Other (See Comments)    insomnia  . Sulfadiazine Other (See Comments)    unknown    Current Outpatient Prescriptions on File Prior to Visit  Medication Sig Dispense Refill  .  aspirin EC 81 MG tablet Take 1 tablet (81 mg total) by mouth daily.    . ferrous sulfate 325 (65 FE) MG EC tablet Take 325 mg by mouth daily with breakfast.    . metoprolol tartrate (LOPRESSOR) 25 MG tablet Take 1 tablet (25 mg total) by mouth 2 (two) times daily. 60 tablet 1  . pantoprazole (PROTONIX) 40 MG tablet TAKE 1 TABLET (40 MG TOTAL) BY MOUTH DAILY. 90 tablet 1  . sertraline (ZOLOFT) 25 MG tablet Take 1 tablet (25 mg total) by mouth daily. 90 tablet 3  . simvastatin (ZOCOR) 20 MG tablet TAKE 1 TABLET (20 MG TOTAL) BY MOUTH AT BEDTIME. 90 tablet 3  . tamsulosin (FLOMAX) 0.4 MG CAPS capsule TAKE 1 CAPSULE (0.4 MG TOTAL) BY MOUTH DAILY AFTER BREAKFAST. 90 capsule 1   No current facility-administered medications on file prior to visit.    BP 122/60 mmHg  Pulse 62  Temp(Src) 97.6 F (36.4 C) (Oral)  Ht 5\' 9"  (1.753 m)  Wt 138 lb (62.596 kg)  BMI 20.37 kg/m2  SpO2 98%    Objective:   Physical Exam  Constitutional: He is oriented to person, place, and time. He appears well-nourished.  Eyes: EOM are normal. Pupils are equal, round, and reactive to light.  Neck: Neck supple.  Cardiovascular: Normal rate.   Pulmonary/Chest: Effort normal and breath sounds normal. He has no wheezes. He has no rales.  Musculoskeletal: Normal range of motion.  Steady gait  Neurological: He is alert and oriented to person, place, and time. No cranial nerve deficit.  Skin: Skin is warm and dry.          Assessment & Plan:  Dizziness with Syncope:  Present for the past 2 weeks. Cares for bedridden wife and likely over exerting himself. Long discussion with both patient and his son today regarding family help with his wife and him. Will also have him scheduled for a follow up appointment with his cardiologist, our staff to call and schedule.  Orthostatic vitals stable today. All labs stable, anemia stable. Discussed proper hydration with water. Strict ER precautions provided over long Holiday  weekend. Will send to PCP as FYI.

## 2016-01-17 NOTE — Telephone Encounter (Signed)
[facility] Patient Name: Ronnie Chan DOB: 1924-11-16 Initial Comment Caller states, concerned father may have taken Rx he was not supposed to. He had a strong dizzy spell. Nurse Assessment Nurse: Markus Daft, RN, Sherre Poot Date/Time (Eastern Time): 01/17/2016 8:42:15 AM Confirm and document reason for call. If symptomatic, describe symptoms. You must click the next button to save text entered. ---Caller states that father had a strong dizzy spell last night. Fatigued. Over the last 2 wks. he is having "eyes feel like they have dust in them." Other times off balance or dizzy. Last night he was standing up, and felt faint and close to passing out for a few seconds. He has not felt it now while sitting. Standing makes it worse. No CP or SOB. -- Friend went over to check meds. Had script of Simvastatin 40 mg QD dated 12/23/15 and old script of 20 mg that were empty bottles. He had been in hospital April/ early May. Also stopped Elequis as the Afib was not an issues anymore. -- Warm conf. father into call. Has the patient traveled out of the country within the last 30 days? ---Not Applicable Does the patient have any new or worsening symptoms? ---Yes Will a triage be completed? ---Yes Related visit to physician within the last 2 weeks? ---Yes Does the PT have any chronic conditions? (i.e. diabetes, asthma, etc.) ---Yes List chronic conditions. ---High cholesterol, HTN, Atrial fibrillation Is this a behavioral health or substance abuse call? ---No Guidelines Guideline Title Affirmed Question Affirmed Notes Dizziness - Lightheadedness [1] Dizziness caused by heat exposure, sudden standing, or poor fluid intake AND [2] no improvement after 2 hours of rest and fluids Final Disposition User See Physician within 4 Hours (or PCP triage) Markus Daft, RN, Sherre Poot Comments Mortimer Fries is with his father right now, and  states that he is eating breakfast, and acting normal this AM. No c/o dizziness this AM. c/o s/s for last 1-2 wks. and with standing. They have not checked BP. Appt made for 1 pm with Alma Friendly, NP. - (Son is driving 2 & 1/2 hrs to come bring his father to appt) Disagree/Comply: Comply

## 2016-01-21 ENCOUNTER — Telehealth: Payer: Self-pay

## 2016-01-22 ENCOUNTER — Other Ambulatory Visit: Payer: Self-pay | Admitting: Family Medicine

## 2016-01-22 MED ORDER — METOPROLOL TARTRATE 25 MG PO TABS: 25.0000 mg | ORAL_TABLET | Freq: Two times a day (BID) | ORAL | Status: DC

## 2016-01-22 NOTE — Telephone Encounter (Signed)
Patient's son notified as instructed by telephone and verbalized understanding. Rush Landmark stated that his dad has been taking Simvastatin 20 mg given by Dr. Damita Dunnings and 40 mg given by his cardiologist. Rush Landmark stated that is one reason that he would like for his dad's cardiologist to be in the [facility] system so that something like this would not happen. Patient's son stated that he is going to talk with his dad about changing to a cardiologist in the [place] system [facility]) and will let Dr. Damita Dunnings know if they need a referral. Rush Landmark stated that he does need a refill on metoprolol, but is fine on his Simvastatin. Patient stated that he has only been taking the Simvastatin 20 mg since his visit with Dr. Damita Dunnings Friday. Rush Landmark stated that when he talked with his dad about seeing the cardiologist his dad said that he had just seen him 3 weeks ago and he will tell him why he needs to go again. Refill sent to the pharmacy for Metoprolol per son's request.

## 2016-01-22 NOTE — Telephone Encounter (Signed)
Patient's son notified as instructed by telephone and verbalized understanding. Rush Landmark stated that he is wondering if the dizziness/syncope that he is having could be related to inner ear problems? Rush Landmark stated that he has witnessed these episodes several times and it looks like his dad has had a little something to drink when this happens. Rush Landmark stated that the patient will rest, have a little something to drink and eat and he is fine.

## 2016-01-22 NOTE — Telephone Encounter (Signed)
Should be on simvastastin 20mg  a day and metoprolol 25mg  BID.  Okay to send in either/both with 90 day supply and 3rf if needing refill.   Needs to see cards re: syncope recently.   Thanks.

## 2016-01-22 NOTE — Telephone Encounter (Signed)
Pt's son Remus Loffler, Brooke Bonito called with concerns about medications.  He states pt has 2 different prescriptions on simvastatin (ZOCOR) 20 MG tablet [id number] and needs a refill on metoprolol tartrate (LOPRESSOR) 25 MG tablet which he states pharmacy cannot refill at this time. Son also has questions as to why pt needs to see cardiologist so soon, says he was just seen.  Best number to call Murriel, Becken (son) is [phone]

## 2016-01-22 NOTE — Telephone Encounter (Signed)
Noted, I would leave the simvastatin at 20mg  for now, since that is what he has been on, to try to limit the possible med changes at this point.   I'll await input/update from patient/family.

## 2016-01-22 NOTE — Telephone Encounter (Signed)
Patient's son Rush Landmark notified as instructed by telephone and verbalized understanding.

## 2016-01-22 NOTE — Telephone Encounter (Signed)
Usually the inner ear issues won't cause full syncope.  He could have both.  I would still have the f/u cards.  I would encourage him to try to snack/eat throughout the day and avoid prolonged fasting.  I think patient has a tendency to skip a meal when caring for his wife (and this is understandable, but may make it more likely for an episode to happen with prolonged fasting).  Thanks.

## 2016-01-27 NOTE — Telephone Encounter (Signed)
error 

## 2016-05-19 ENCOUNTER — Telehealth: Payer: Self-pay | Admitting: Cardiovascular Disease

## 2016-05-19 NOTE — Telephone Encounter (Signed)
Per Family patient doesn't see Dr. Fletcher Anon ,  And doesn't need fu appt.  Deleting recall.

## 2016-06-03 ENCOUNTER — Other Ambulatory Visit: Payer: Self-pay | Admitting: Family Medicine

## 2016-06-09 ENCOUNTER — Other Ambulatory Visit: Payer: Self-pay | Admitting: Family Medicine

## 2016-09-25 ENCOUNTER — Telehealth: Payer: Self-pay | Admitting: Family Medicine

## 2016-09-25 NOTE — Telephone Encounter (Signed)
Call pt's family, ie Lattie Haw, not patient.  Note from [place] reviewed.  I've been thinking about her situation.  Patient needs help, may need med adjustment, ie inc in zoloft may help, but I need patient to know what is going on and why the med is being adjusted.  I don't want to do this out of the blue.  If the patient doesn't report her observations, then it would be hard for me to justify the med change.  It would be helpful if a family member could come to the office visit, 37min, to help provide some history.   That would allow disclosure to/in front of the patient and be a more direct way of addressing the situation.  Thanks.

## 2016-09-25 NOTE — Telephone Encounter (Signed)
Patient's son Ronnie Chan notified as instructed by telephone and verbalized understanding. Ronnie Chan stated that he lives in [place], but is the one that comes with him for his appointments. Appointment scheduled as instructed.

## 2016-09-28 ENCOUNTER — Telehealth: Payer: Self-pay

## 2016-09-28 NOTE — Telephone Encounter (Signed)
Ronnie Chan. Left v/m that he is bringing his father to see Dr Damita Dunnings on 09/30/16 at 3:30. Ronnie Chan wants to speak with Dr Damita Dunnings prior to appt; has some very personal information wants Dr Damita Dunnings to have prior to appt.

## 2016-09-29 ENCOUNTER — Ambulatory Visit: Payer: Medicare Other | Admitting: Family Medicine

## 2016-09-29 NOTE — Telephone Encounter (Signed)
Please see what details you can get in the meantime.  Thanks.

## 2016-09-30 ENCOUNTER — Ambulatory Visit (INDEPENDENT_AMBULATORY_CARE_PROVIDER_SITE_OTHER): Payer: Medicare Other | Admitting: Family Medicine

## 2016-09-30 ENCOUNTER — Encounter: Payer: Self-pay | Admitting: Family Medicine

## 2016-09-30 DIAGNOSIS — I4819 Other persistent atrial fibrillation: Secondary | ICD-10-CM

## 2016-09-30 DIAGNOSIS — I481 Persistent atrial fibrillation: Secondary | ICD-10-CM

## 2016-09-30 DIAGNOSIS — Z636 Dependent relative needing care at home: Secondary | ICD-10-CM | POA: Diagnosis not present

## 2016-09-30 MED ORDER — SERTRALINE HCL 50 MG PO TABS: 50.0000 mg | ORAL_TABLET | Freq: Every day | ORAL | 3 refills | Status: DC

## 2016-09-30 NOTE — Patient Instructions (Signed)
Try the higher dose of sertraline and update me as needed.  If you don't have blood drawn at the cardiology appointment, then let me know.  I want you to follow up with the cardiology clinic since you have decreased pulses in your feet.  Take care.  Glad to see you.

## 2016-09-30 NOTE — Telephone Encounter (Signed)
Spoke to patient's son.

## 2016-09-30 NOTE — Progress Notes (Signed)
Update the patient's situation, he is still caring for his debilitated and demented wife who is bedbound. He has multiple family members helping out at home.He frequently disagrees with his daughter in law, Ronnie Chan.  Ronnie Chan does help care for Ronnie Chan; she does provide good help with Ronnie Chan.    He prev had extra help at home, an extra caregiver.  She is not present now, after a reported disagreement with Ronnie Chan.    He wants the best care possible for his wife, and he would prefer for that care to be delivered at home as long as possible. Discussed with patient that no one single person could ever be expected to provide all the care needed for his wife, given her needs.  I checked the patient's memory today at the office visit.  Knows year, month, day of the week.  Can do math 3/3 recall Can read a watch.  He admits to occ lapses with names but not high risk events.   At this point based on a conversation it appears that he is able to make informed choices regarding his care and also his wife's care. There is no reason to declare Ronnie Chan incompetent, based on today's exam.  History of depression. Taking sertraline. Compliant. In the last month, he usually has about 5 days where he feels his mood is down. Patient and family admit he is having a good day today.    PMH and SH reviewed  ROS: Per HPI unless specifically indicated in ROS section   Meds, vitals, and allergies reviewed.   GEN: nad, alert and oriented, hard of hearing.  HEENT: mucous membranes moist NECK: supple w/o LA CV:IRR not tachy PULM: ctab, no inc wob ABD: soft, +bs EXT: no edema SKIN: no acute rash Decreased dorsalis pedis pulses bilaterally

## 2016-09-30 NOTE — Telephone Encounter (Signed)
Thanks

## 2016-09-30 NOTE — Progress Notes (Signed)
Pre visit review using our clinic review tool, if applicable. No additional management support is needed unless otherwise documented below in the visit note. 

## 2016-09-30 NOTE — Telephone Encounter (Signed)
Spoke to patient's son and he had questions about the phone call last week?  He said no one has admitted to contacting the office about his dad prior to the call from the office.  Patient's son Ronnie Chan stated that his dad has told him that he is very unhappy with his living arrangements. Advised Mr. Charity that Dr. Damita Dunnings will be aware of this before the appointment today.

## 2016-10-01 NOTE — Assessment & Plan Note (Signed)
Persistent A. fib noted on exam. He also has decreased dorsalis pedis pulses. We will route this note cardiology regarding follow-up. Appreciate help of all involved.

## 2016-10-01 NOTE — Assessment & Plan Note (Addendum)
Patient still looks like he can make informed decisions and he would be competent to make his own care decisions and also the care decisions for his wife. She is not competent or able to make her own care decisions. He does need help at home. He does have frequent disagreements with Lattie Haw. Lattie Haw does help care for his wife. I talked with the patient and his son about trying to make arrangements where they can get extra help in the house. I told the patient that this was not a "medical" issue that I could address with a prescription. He is going to talk with his son and his family and see what arrangements can be made at home to meet his needs and also his wife needs. >45 minutes spent in face to face time with patient, >50% spent in counselling or coordination of care.   He does seem to have some depressive episodes or symptoms but he is not suicidal or homicidal and he is still okay for outpatient follow-up. Discussed with him about increasing his sertraline to 50 mg to see if that will help his mood. He agrees.  Rx sent.

## 2016-11-02 ENCOUNTER — Telehealth: Payer: Self-pay | Admitting: Family Medicine

## 2016-11-02 NOTE — Telephone Encounter (Signed)
Son called and pt saw Dr. Marella Bile on 10/30/16 1:30 pm. They are recommending a C-Scan of the aorta and also Dr. Chancy Milroy was going to do a MET labs.  Son would like a callback because all of this seems confusing to him since he was with his dad on Friday 10/30/16 and none of these procedures we discussed at that time.  Dr. Chancy Milroy mentioned that pt had significant plaque buildup in 3 of his arteries. I called to get records faxed from Dr. Marella Bile office to PCP.  Best number to call is [phone]

## 2016-11-03 NOTE — Telephone Encounter (Signed)
Pt's son called back.  Explained that PCP is out of the office today unexpectedly.  Would he like his father's records reviewed by another provider?  He stated, "no, even if Dr. Damita Dunnings is out of the office, he would like to speak to Dr. Damita Dunnings about his father's treatment."  Called Dr. Marella Bile office back, spoke to [facility].  She is waiting for pt to call to schedule the CTA w/ [facility].  Gave Meghan the confidential fax # to our office to fax the office notes and labs ASAP.  Pt's son expressed understanding that PCP will call when he is back in the office.

## 2016-11-04 NOTE — Telephone Encounter (Signed)
Let them know I'll call back as soon as I can but I need to review the records from the cardiology clinic.   I won't have anything to add to the conversation at this point.  If they have specific questions for me to consider in the meantime, then let me know.   Thanks.

## 2016-11-04 NOTE — Telephone Encounter (Signed)
Left message on voicemail for patient's son to call back. 

## 2016-11-05 NOTE — Telephone Encounter (Signed)
Please see what info you can get from cardiology other than the OV (which I reviewed).  OV note mentions problem of PVD but doesn't list a plan re: PVD, etc.  Thanks.

## 2016-11-06 NOTE — Telephone Encounter (Signed)
If they have a compelling reason to proceed with extra testing, ie ABI was abnormal, then I think it would make sense to proceed.  That is admitting that I don't have all of the final reports/info yet.  Thanks.

## 2016-11-06 NOTE — Telephone Encounter (Signed)
I spoke to pt's son and he wanted to let you know that pt had f/u scheduled for 02/12/17 for card on Friday---he states the ABI test from Fri was abnormal and was Dx with an IE in leg but did not specify which one.... He reports he is worried and not sure how to proceed... He has not scheduled any of the procedures because he wants to get your input... He is aware that you are currently seeking to get more information...please advise

## 2016-11-09 NOTE — Telephone Encounter (Signed)
Patient's son notified as instructed by telephone and verbalized understanding. 

## 2016-11-09 NOTE — Telephone Encounter (Signed)
Left message on voicemail for Ronnie Chan to call back.

## 2016-11-09 NOTE — Telephone Encounter (Signed)
Mr Hair left v/m returning call and request cb.

## 2016-12-01 ENCOUNTER — Other Ambulatory Visit: Payer: Self-pay | Admitting: Family Medicine

## 2016-12-01 NOTE — Telephone Encounter (Signed)
Received refill electronically Last refill 06/03/16 #90/1 Last office visit 09/30/16 See allergy/contraindication

## 2016-12-02 NOTE — Telephone Encounter (Signed)
Has tolerated. Okay to continue. Refills sent. Thanks.

## 2016-12-30 ENCOUNTER — Telehealth: Payer: Self-pay | Admitting: Family Medicine

## 2016-12-30 NOTE — Telephone Encounter (Signed)
Report is on your desk

## 2016-12-30 NOTE — Telephone Encounter (Signed)
D/w pt.  He had imaging done with Dr. Chancy Milroy at [place].  Please see about getting CT report.  Patient has f/u with Dr. Chancy Milroy pending.  Thanks.

## 2016-12-30 NOTE — Telephone Encounter (Signed)
Spoke to Spencer at Dr. Marella Bile office and was advised that she will fax the report over.

## 2017-01-03 NOTE — Telephone Encounter (Signed)
I looked back at the imaging report. Please call patient's son and give him a update. I had discussed this plan with the patient, about notifying his son, when I went by his house to see the patient and his homebound wife.  I was not involved in ordering or management of this study, I just recently saw the report. The CAT scan was done on 11/17/2016. It shows extensive atherosclerotic calcifications in the abdominal vessels as well as the vessels the lower extremities with probable moderate to severe occlusive peripheral vascular disease. This means that he likely has significant narrowing of the arteries in the legs. I don't know what all cardiology could do for these lesions and I need their input on this.  There is probable stenosis in the arteries that lead to the kidneys. He may not need any intervention on this.  He has an incidental right kidney cyst and a gallstone noted. 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 The gallstone, renal cyst, and the enlargement of the aorta are likely not clinically significant at this point.  He does have bilateral lung lesions that could represent scarring. It was recommended to follow-up with noncontrast CT in 3 months to make sure these are not changing. This is commonly done on lung lesions that were found incidentally and of uncertain significance.  The patient reportedly has follow-up pending with cardiology. If this is not addressed at that visit then I need to know about that.  Thanks.

## 2017-01-04 NOTE — Telephone Encounter (Signed)
Phoned patient's home and Ronnie Chan says his son is back up in [place] and he will ask him to call us.

## 2017-01-04 NOTE — Telephone Encounter (Signed)
Patients son advised

## 2017-06-05 ENCOUNTER — Other Ambulatory Visit: Payer: Self-pay | Admitting: Family Medicine

## 2017-09-03 ENCOUNTER — Other Ambulatory Visit: Payer: Self-pay | Admitting: Family Medicine

## 2017-09-22 ENCOUNTER — Other Ambulatory Visit: Payer: Self-pay | Admitting: Family Medicine

## 2017-09-22 NOTE — Telephone Encounter (Signed)
Electronic refill request Last office visit 09/30/16, last refill same date #90/3

## 2017-09-23 ENCOUNTER — Encounter: Payer: Self-pay | Admitting: *Deleted

## 2017-09-23 NOTE — Telephone Encounter (Signed)
Sent.  OV if/when possible.  If not possible, then let me know.  Thanks.

## 2017-09-23 NOTE — Telephone Encounter (Signed)
Letter mailed

## 2017-10-02 ENCOUNTER — Other Ambulatory Visit: Payer: Self-pay | Admitting: Family Medicine

## 2017-10-04 NOTE — Telephone Encounter (Signed)
Electronic refill request. Simvastatin Last office visit:   09/30/16   No upcoming appts scheduled. Last Filled:   09/03/17   #30   0 RF Please advise.

## 2017-10-04 NOTE — Telephone Encounter (Signed)
Sent. OV when possible, ie later this spring.  Thanks.

## 2017-10-05 ENCOUNTER — Encounter: Payer: Self-pay | Admitting: *Deleted

## 2017-10-05 NOTE — Telephone Encounter (Signed)
Letter mailed

## 2017-10-12 ENCOUNTER — Other Ambulatory Visit: Payer: Self-pay | Admitting: *Deleted

## 2017-11-19 ENCOUNTER — Other Ambulatory Visit: Payer: Self-pay | Admitting: Family Medicine

## 2017-11-19 NOTE — Telephone Encounter (Signed)
Electronic refill request. Tamsulosin Last office visit:   09/30/2016  No upcoming appts scheduled. Last Filled:    90 capsule 1 06/07/2017  Please advise.

## 2017-11-21 NOTE — Telephone Encounter (Signed)
Sent.  Needs 37min OV with family member present if/when possible.   If not possible, then let me know.   Thanks.

## 2017-11-22 NOTE — Telephone Encounter (Signed)
Spoke with Claudean Severance and she states she needs to get in touch with her brother in law and see when they can get patient in and have someone to come with him. They will call back-Anastasiya Estell Harpin, RMA

## 2017-11-22 NOTE — Telephone Encounter (Signed)
Please schedule appointment as instructed. 

## 2017-11-22 NOTE — Telephone Encounter (Signed)
Noted. Thanks.

## 2017-12-13 ENCOUNTER — Encounter: Payer: Self-pay | Admitting: Family Medicine

## 2017-12-13 ENCOUNTER — Ambulatory Visit (INDEPENDENT_AMBULATORY_CARE_PROVIDER_SITE_OTHER): Payer: Medicare Other | Admitting: Family Medicine

## 2017-12-13 ENCOUNTER — Telehealth: Payer: Self-pay | Admitting: Family Medicine

## 2017-12-13 VITALS — BP 110/40 | HR 77 | Temp 97.8°F | Wt 133.8 lb

## 2017-12-13 DIAGNOSIS — E7800 Pure hypercholesterolemia, unspecified: Secondary | ICD-10-CM

## 2017-12-13 DIAGNOSIS — I480 Paroxysmal atrial fibrillation: Secondary | ICD-10-CM

## 2017-12-13 DIAGNOSIS — Z636 Dependent relative needing care at home: Secondary | ICD-10-CM

## 2017-12-13 DIAGNOSIS — D649 Anemia, unspecified: Secondary | ICD-10-CM

## 2017-12-13 LAB — COMPREHENSIVE METABOLIC PANEL
ALBUMIN: 3.7 g/dL (ref 3.5–5.2)
ALK PHOS: 55 U/L (ref 39–117)
ALT: 12 U/L (ref 0–53)
AST: 19 U/L (ref 0–37)
BUN: 38 mg/dL — ABNORMAL HIGH (ref 6–23)
CALCIUM: 9.7 mg/dL (ref 8.4–10.5)
CO2: 28 mEq/L (ref 19–32)
Chloride: 105 mEq/L (ref 96–112)
Creatinine, Ser: 1.36 mg/dL (ref 0.40–1.50)
GFR: 52.19 mL/min — AB (ref >60.00)
Glucose, Bld: 87 mg/dL (ref 70–99)
POTASSIUM: 4.7 meq/L (ref 3.5–5.1)
Sodium: 141 mEq/L (ref 135–145)
TOTAL PROTEIN: 6.4 g/dL (ref 6.0–8.3)
Total Bilirubin: 0.7 mg/dL (ref 0.2–1.2)

## 2017-12-13 LAB — CBC WITH DIFFERENTIAL/PLATELET
BASOS ABS: 0 10*3/uL (ref 0.0–0.1)
Basophils Relative: 0.5 % (ref 0.0–3.0)
EOS ABS: 0.2 10*3/uL (ref 0.0–0.7)
Eosinophils Relative: 3.2 % (ref 0.0–5.0)
HCT: 40 % (ref 39.0–52.0)
Hemoglobin: 13.4 g/dL (ref 13.0–17.0)
LYMPHS ABS: 0.9 10*3/uL (ref 0.7–4.0)
Lymphocytes Relative: 12.7 % (ref 12.0–46.0)
MCHC: 33.4 g/dL (ref 30.0–36.0)
MCV: 94.7 fl (ref 78.0–100.0)
MONO ABS: 0.5 10*3/uL (ref 0.1–1.0)
Monocytes Relative: 7.3 % (ref 3.0–12.0)
NEUTROS ABS: 5.5 10*3/uL (ref 1.4–7.7)
NEUTROS PCT: 76.3 % (ref 43.0–77.0)
PLATELETS: 188 10*3/uL (ref 150.0–400.0)
RBC: 4.23 Mil/uL (ref 4.22–5.81)
RDW: 13.8 % (ref 11.5–15.5)
WBC: 7.2 10*3/uL (ref 4.0–10.5)

## 2017-12-13 LAB — LIPID PANEL
CHOLESTEROL: 141 mg/dL (ref 0–200)
HDL: 51.3 mg/dL (ref >39.00)
LDL Cholesterol: 69 mg/dL (ref 0–99)
NONHDL: 89.21
Total CHOL/HDL Ratio: 3
Triglycerides: 99 mg/dL (ref 0.0–149.0)
VLDL: 19.8 mg/dL (ref 0.0–40.0)

## 2017-12-13 LAB — TSH: TSH: 2.28 u[IU]/mL (ref 0.35–4.50)

## 2017-12-13 MED ORDER — METOPROLOL SUCCINATE ER 25 MG PO TB24: 12.5000 mg | ORAL_TABLET | Freq: Every day | ORAL | Status: DC | PRN

## 2017-12-13 MED ORDER — METOPROLOL SUCCINATE ER 25 MG PO TB24: 12.5000 mg | ORAL_TABLET | Freq: Every day | ORAL | Status: DC

## 2017-12-13 NOTE — Telephone Encounter (Addendum)
Patient caregiver advised. (? Daughter-in-law)

## 2017-12-13 NOTE — Progress Notes (Signed)
Here today with his son.    H/o anemia.  D/w pt about recheck labs.  Still on iron and aspirin.    Wife with dementia at home at baseline.  That is stressful.  He has some family help.  He may need inc in SSRI later on.  D/w pt.  See below.   H/o AF.  Fatigued, less patient. Low BP noted, d/w pt about dec in BB dose, down to 12.5mg  a day.  No CP.  Not SOB.  Still on 46m ASA a day.  No bleeding.    Weight loss noted over the years but more stable recently.  Family is helping with meals, etc.  PMH and SH reviewed  ROS: Per HPI unless specifically indicated in ROS section   Meds, vitals, and allergies reviewed.   GEN: nad, alert and oriented, he recalls recent events. HEENT: mucous membranes moist NECK: supple w/o LA CV: IRR, not tachy PULM: ctab, no inc wob ABD: soft, +bs EXT: no edema SKIN: no acute rash

## 2017-12-13 NOTE — Telephone Encounter (Signed)
Copied from [place] [id number]. Topic: Quick Communication - Rx Refill/Question >> Dec 13, 2017  2:03 PM Scherrie Gerlach wrote: Medication: metoprolol succinate (TOPROL-XL) 25 MG 24 hr tablet Pt saw Dr Damita Dunnings today. AVS and the dr states to cut this med in half.  But when Lowell got home, he realized this med is already being cut in half. So his question is, does pt need to stop this med for a while, or go to a fourth of a tab. (but this might be difficult to do) Pt had low bp today anyway. Please advise.

## 2017-12-13 NOTE — Telephone Encounter (Signed)
I would stop it for now.  If he has tachycardia >100, then would restart 12.5mg  tab per day.  Thanks.   Update me in about 10 days.

## 2017-12-13 NOTE — Patient Instructions (Signed)
Cut the metoprolol back to 1/2 tab (12.5mg ) a day.  See how that does and see if you feel better.  If not then let me know.  Take care.  Glad to see you.  Go to the lab on the way out.  We'll contact you with your lab report.

## 2017-12-14 ENCOUNTER — Ambulatory Visit: Payer: Medicare Other | Admitting: Family Medicine

## 2017-12-14 ENCOUNTER — Other Ambulatory Visit (INDEPENDENT_AMBULATORY_CARE_PROVIDER_SITE_OTHER): Payer: Medicare Other

## 2017-12-14 DIAGNOSIS — E538 Deficiency of other specified B group vitamins: Secondary | ICD-10-CM

## 2017-12-14 LAB — VITAMIN B12: Vitamin B-12: 236 pg/mL (ref 211–911)

## 2017-12-14 NOTE — Telephone Encounter (Signed)
Thanks, yes, daughter in law is in the home.

## 2017-12-14 NOTE — Assessment & Plan Note (Addendum)
He has been fatigued and less patient overall.  He has a lower blood pressure today.  Initial plan was to decrease his Toprol down to 12.5 mg a day.  It turns out he was already decreased down to 12.5 mg a day, see following phone note.  I would stop it for now since he is not tachycardic.  He can restart if he has significant tachycardia.  Continue aspirin otherwise.  Continue statin for now.  Family/patient can update me about how he is doing in the relatively near future, we will see if this helps his energy level.  See notes on labs.   >25 minutes spent in face to face time with patient, >50% spent in counselling or coordination of care.

## 2017-12-14 NOTE — Assessment & Plan Note (Signed)
Wife with dementia at home at baseline.  That is stressful.  He has some family help.  He may need inc in SSRI later on.  D/w pt. if decreasing his beta-blocker does not help with his mood then we may end up increasing his sertraline.  Patient/family can update me about his condition.  All in agreement with plan.

## 2017-12-14 NOTE — Assessment & Plan Note (Signed)
H/o see notes on labs.

## 2017-12-20 ENCOUNTER — Other Ambulatory Visit: Payer: Self-pay | Admitting: Family Medicine

## 2017-12-20 MED ORDER — VITAMIN B-12 1000 MCG PO TABS: 1000.0000 ug | ORAL_TABLET | Freq: Every day | ORAL | Status: AC

## 2018-02-21 ENCOUNTER — Ambulatory Visit (INDEPENDENT_AMBULATORY_CARE_PROVIDER_SITE_OTHER): Payer: Medicare Other | Admitting: Family Medicine

## 2018-02-21 ENCOUNTER — Encounter: Payer: Self-pay | Admitting: Family Medicine

## 2018-02-21 VITALS — BP 114/68 | HR 78 | Temp 98.4°F | Ht 69.0 in | Wt 131.2 lb

## 2018-02-21 DIAGNOSIS — H6120 Impacted cerumen, unspecified ear: Secondary | ICD-10-CM

## 2018-02-21 DIAGNOSIS — R05 Cough: Secondary | ICD-10-CM

## 2018-02-21 DIAGNOSIS — R059 Cough, unspecified: Secondary | ICD-10-CM

## 2018-02-21 DIAGNOSIS — Z636 Dependent relative needing care at home: Secondary | ICD-10-CM | POA: Diagnosis not present

## 2018-02-21 NOTE — Progress Notes (Signed)
He is still moving 3 acres at home and still helping care for his wife.  He is off beta blocker.    Hearing is affected esp L ear.  Using hearing aids.  Needs check today.  No ear pain but some itching.  Feels like something is in the L canal, other than the hearing aid.   Some cough.  occ whitish sputum.  No fevers.    Meds, vitals, and allergies reviewed.   ROS: Per HPI unless specifically indicated in ROS section   nad ncat L cerumen impaction, recheck wnl after irrigation.  He felt like he was hearing better after irrigation OP wnl Neck supple no LA Ctab, occ cough noted.  No focal dec in BS IRR but not tachy Ext w/o edema.

## 2018-02-21 NOTE — Patient Instructions (Signed)
Don't change your meds for now.  Don't use qtips.  Update me as needed.  Take care.  Glad to see you.

## 2018-02-22 DIAGNOSIS — H6120 Impacted cerumen, unspecified ear: Secondary | ICD-10-CM | POA: Insufficient documentation

## 2018-02-22 NOTE — Assessment & Plan Note (Signed)
Ctab, some better recently, he'll update me as needed.

## 2018-02-22 NOTE — Assessment & Plan Note (Addendum)
Discussed.  See above, he is trying to work through the chronic strain of caring for his wife, has family help.  He'll update me as needed.   >15 minutes spent in face to face time with patient, >50% spent in counselling or coordination of care

## 2018-02-22 NOTE — Assessment & Plan Note (Signed)
Resolved.  F/u prn.  He agrees.

## 2018-03-29 ENCOUNTER — Ambulatory Visit: Payer: Medicare Other | Admitting: Family Medicine

## 2018-04-04 ENCOUNTER — Ambulatory Visit: Payer: Medicare Other | Admitting: Family Medicine

## 2018-05-09 ENCOUNTER — Other Ambulatory Visit: Payer: Self-pay | Admitting: Family Medicine

## 2018-05-26 ENCOUNTER — Other Ambulatory Visit: Payer: Self-pay | Admitting: Family Medicine

## 2018-06-17 ENCOUNTER — Encounter: Payer: Self-pay | Admitting: Family Medicine

## 2018-08-03 ENCOUNTER — Ambulatory Visit (INDEPENDENT_AMBULATORY_CARE_PROVIDER_SITE_OTHER): Payer: Medicare Other | Admitting: Family Medicine

## 2018-08-03 ENCOUNTER — Encounter: Payer: Self-pay | Admitting: Family Medicine

## 2018-08-03 VITALS — BP 112/68 | HR 64 | Temp 97.6°F | Ht 69.0 in | Wt 132.2 lb

## 2018-08-03 DIAGNOSIS — R5382 Chronic fatigue, unspecified: Secondary | ICD-10-CM

## 2018-08-03 DIAGNOSIS — R350 Frequency of micturition: Secondary | ICD-10-CM | POA: Diagnosis not present

## 2018-08-03 DIAGNOSIS — R5383 Other fatigue: Secondary | ICD-10-CM | POA: Insufficient documentation

## 2018-08-03 DIAGNOSIS — D500 Iron deficiency anemia secondary to blood loss (chronic): Secondary | ICD-10-CM

## 2018-08-03 LAB — POC URINALSYSI DIPSTICK (AUTOMATED)
Bilirubin, UA: NEGATIVE
GLUCOSE UA: NEGATIVE
Ketones, UA: NEGATIVE
Leukocytes, UA: NEGATIVE
Nitrite, UA: NEGATIVE
Protein, UA: NEGATIVE
RBC UA: NEGATIVE
Spec Grav, UA: 1.025 (ref 1.010–1.025)
Urobilinogen, UA: 0.2 E.U./dL
pH, UA: 6 (ref 5.0–8.0)

## 2018-08-03 NOTE — Patient Instructions (Addendum)
Please spread out meals with protein (or snacks) more regularly  Especially at night  Peanut butter is a great idea   Drink fluids   Let's check urine today for infection  Also blood for cbc, cmet , and tsh   We will let you know results   When you drink pepsi-eat something also   When you check out- let them know what phone number to call back

## 2018-08-03 NOTE — Assessment & Plan Note (Signed)
ua is neg Hx of BPH   Enc more water intake / SG of urine is high

## 2018-08-03 NOTE — Assessment & Plan Note (Signed)
Worse in ams  Possibly multifactorial  (son says he lost wife recently) Not eating regularly (feels better after drinking a pepsi)  Not sleeping well  Loose stool on and off- actually better now   UA neg today (concentrated however so enc to inc fluids) Lab today  Reassuring exam inst to eat more regular meals /snacks with protein  Will update

## 2018-08-03 NOTE — Assessment & Plan Note (Signed)
More sluggish over past 2 weeks  Worse in ams No abd pain  Some diarrhea on and off  Takes iron  Lab today

## 2018-08-03 NOTE — Progress Notes (Signed)
Subjective:    Patient ID: Ronnie Chan, male    DOB: [birth year], 84 y.o.   MRN: [id number]  HPI Here with fatigue and bowel problems   Wt Readings from Last 3 Encounters:  08/03/18 132 lb 4 oz (60 kg)  02/21/18 131 lb 4 oz (59.5 kg)  12/13/17 133 lb 12 oz (60.7 kg)   19.53 kg/m   84 yo pt of Dr Damita Dunnings  Pt states he was tired after getting dressed  Unsure how long it has been going on (but worse today)  Was traveling in [place] with son last week   Son notices some decrease in energy/lethargy in the mornings   Noted last week some loose bms as well (with incontinence once)  Thinks it was from eating differently during travel   H/o diverticular problems and also utis in the past   No diarrhea since coming home  No blood in stool at all  Takes iron - has black stool   Lab Results  Component Value Date   WBC 7.2 12/13/2017   HGB 13.4 12/13/2017   HCT 40.0 12/13/2017   MCV 94.7 12/13/2017   PLT 188.0 12/13/2017    He had some pain in R side pain -since a fall (not today(  No pain to urinate  Does have frequency  occ looks orange / but not darker  Does have incontinence  Takes flomax  No urine odor   Feels better now  Just had a pepsi   UA today Results for orders placed or performed in visit on 08/03/18  POCT Urinalysis Dipstick (Automated)  Result Value Ref Range   Color, UA Dark Yellow    Clarity, UA Clear    Glucose, UA Negative Negative   Bilirubin, UA Negative    Ketones, UA Negative    Spec Grav, UA 1.025 1.010 - 1.025   Blood, UA Negative    pH, UA 6.0 5.0 - 8.0   Protein, UA Negative Negative   Urobilinogen, UA 0.2 0.2 or 1.0 E.U./dL   Nitrite, UA Negative    Leukocytes, UA Negative Negative       BP Readings from Last 3 Encounters:  08/03/18 112/68  02/21/18 114/68  12/13/17 (!) 110/40  he has been careful about getting up slowly  Pulse Readings from Last 3 Encounters:  08/03/18 64  02/21/18 78  12/13/17 77    Patient Active  Problem List   Diagnosis Date Noted  . Fatigue 08/03/2018  . Urinary frequency 08/03/2018  . Cerumen impaction 02/22/2018  . Anemia, iron deficiency   . Occult blood in stools   . Absolute anemia   . Pain in the chest   . Bleeding gastrointestinal   . Anemia 11/12/2015  . Atrial fibrillation ([place]) 04/05/2015  . Irregular heart beat 03/27/2015  . Pain in joint, shoulder region 02/25/2015  . Cough 01/24/2015  . Medicare annual wellness visit, subsequent 07/29/2014  . Advance directive discussed with patient 05/18/2012  . Caregiver stress 05/18/2012  . Hypercholesteremia 04/16/2011  . ANOREXIA 01/14/2010  . DIVERTICULOSIS, COLON, WITH HEMORRHAGE 11/27/2009  . B12 deficiency 01/17/2007  . GERD 01/17/2007   Past Medical History:  Diagnosis Date  . Atrial fibrillation [facility]) summer 2017   Eliquis initiated.   . B12 deficiency   . CKD (chronic kidney disease)    stage 3 in 03/2015  . Diverticulitis of colon with bleeding 03/11   Hospital 03/25-03/29/11, ARF due dehydr, acute blood loss anemia due divertic bleed  .  History of CT scan of abdomen 11/29/09   No acute process, large R parapelvic renal cyst calcull  . History of ETT 11/24/09   myoview, nml EF 48%  . history of mini stroke 9   Dr. Earley Favor  . Lower GI bleed 04/11    acute blood loss anemia, transfusion 4 units PRBC's, non stemi   Past Surgical History:  Procedure Laterality Date  . CATARACT EXTRACTION, BILATERAL  09/1997- 10/1997  . COLONOSCOPY N/A 11/15/2015   Procedure: COLONOSCOPY;  Surgeon: Ladene Artist, MD;  Location[facility] ENDOSCOPY;  Service: Endoscopy;  Laterality: N/A;  . ESOPHAGOGASTRODUODENOSCOPY N/A 11/15/2015   Procedure: ESOPHAGOGASTRODUODENOSCOPY (EGD);  Surgeon: Ladene Artist, MD;  Location: [facility] ENDOSCOPY;  Service: Endoscopy;  Laterality: N/A;  . SEPTOPLASTY  1990   Dr. Ernesto Rutherford, left  . TRACHEOSTOMY  7 yoa   Strep throat   Social History   Tobacco Use  . Smoking status: Never Smoker  .  Smokeless tobacco: Never Used  Substance Use Topics  . Alcohol use: No    Alcohol/week: 0.0 standard drinks  . Drug use: No   Family History  Problem Relation Age of Onset  . Heart disease Father        CHF  . Heart disease Paternal Grandfather        heart attacks   Allergies  Allergen Reactions  . Lisinopril Other (See Comments)    insomnia  . Sulfadiazine Other (See Comments)    unknown   Current Outpatient Medications on File Prior to Visit  Medication Sig Dispense Refill  . ferrous sulfate 325 (65 FE) MG EC tablet Take 325 mg by mouth daily with breakfast.    . omeprazole (PRILOSEC) 10 MG capsule Take 1 capsule by mouth daily.  1  . sertraline (ZOLOFT) 50 MG tablet TAKE 1 TABLET (50 MG TOTAL) BY MOUTH DAILY. 90 tablet 3  . tamsulosin (FLOMAX) 0.4 MG CAPS capsule TAKE 1 CAPSULE (0.4 MG TOTAL) BY MOUTH DAILY AFTER BREAKFAST. 90 capsule 1  . vitamin B-12 (CYANOCOBALAMIN) 1000 MCG tablet Take 1 tablet (1,000 mcg total) by mouth daily.     No current facility-administered medications on file prior to visit.     Review of Systems  Constitutional: Positive for fatigue. Negative for activity change, appetite change, chills, diaphoresis, fever and unexpected weight change.  HENT: Positive for postnasal drip. Negative for congestion, rhinorrhea, sore throat and trouble swallowing.   Eyes: Negative for pain, redness, itching and visual disturbance.  Respiratory: Negative for cough, choking, chest tightness, shortness of breath and wheezing.   Cardiovascular: Negative for chest pain and palpitations.  Gastrointestinal: Negative for abdominal distention, abdominal pain, anal bleeding, blood in stool, constipation, diarrhea, nausea, rectal pain and vomiting.       Loose stool is resolved (but happens occ)  Stool is black from daily iron   Endocrine: Negative for cold intolerance, heat intolerance, polydipsia and polyuria.  Genitourinary: Positive for frequency. Negative for decreased  urine volume, difficulty urinating, dysuria, enuresis, flank pain, hematuria, testicular pain and urgency.  Musculoskeletal: Negative for arthralgias, back pain, gait problem, joint swelling, myalgias, neck pain and neck stiffness.  Skin: Negative for pallor and rash.  Neurological: Negative for dizziness, tremors, facial asymmetry, weakness, light-headedness, numbness and headaches.  Hematological: Negative for adenopathy. Does not bruise/bleed easily.  Psychiatric/Behavioral: Negative for decreased concentration and dysphoric mood. The patient is not nervous/anxious.        Objective:   Physical Exam  Constitutional: He appears well-developed and  well-nourished. No distress.  Slim to underweight frail appearing elderly male  HENT:  Head: Normocephalic and atraumatic.  Right Ear: External ear normal.  Left Ear: External ear normal.  Nose: Nose normal.  Mouth/Throat: Oropharynx is clear and moist.  Eyes: Pupils are equal, round, and reactive to light. Conjunctivae and EOM are normal. Right eye exhibits no discharge. Left eye exhibits no discharge. No scleral icterus.  Neck: Normal range of motion. Neck supple. No JVD present. Carotid bruit is not present. No tracheal deviation present. No thyromegaly present.  Cardiovascular: Normal rate, normal heart sounds and intact distal pulses. Exam reveals no gallop.  Irregularly irreg rhythm of a fib  Pulmonary/Chest: Effort normal and breath sounds normal. No stridor. No respiratory distress. He has no wheezes. He has no rales. He exhibits no tenderness.  Abdominal: Soft. Bowel sounds are normal. He exhibits no distension, no abdominal bruit and no mass. There is no tenderness. There is no rebound and no guarding. No hernia.  Musculoskeletal: He exhibits no edema or tenderness.  Mild kyphosis  Lymphadenopathy:    He has no cervical adenopathy.  Neurological: He is alert. He has normal reflexes. He displays normal reflexes. No cranial nerve  deficit. He exhibits normal muscle tone. Coordination normal.  Skin: Skin is warm and dry. No rash noted. No erythema. No pallor.  Psychiatric: He has a normal mood and affect. His behavior is normal. His speech is tangential.  Pleasant  Some difficulty with communication due to hearing loss   Very tangential in speech and thought Helpful son is here           Assessment & Plan:   Problem List Items Addressed This Visit      Other   Urinary frequency    ua is neg Hx of BPH   Enc more water intake / SG of urine is high       Relevant Orders   POCT Urinalysis Dipstick (Automated) (Completed)   Fatigue - Primary (Chronic)    Worse in ams  Possibly multifactorial  (son says he lost wife recently) Not eating regularly (feels better after drinking a pepsi)  Not sleeping well  Loose stool on and off- actually better now   UA neg today (concentrated however so enc to inc fluids) Lab today  Reassuring exam inst to eat more regular meals /snacks with protein  Will update       Relevant Orders   CBC with Differential/Platelet   Comprehensive metabolic panel   TSH   Anemia, iron deficiency    More sluggish over past 2 weeks  Worse in ams No abd pain  Some diarrhea on and off  Takes iron  Lab today      Relevant Orders   CBC with Differential/Platelet

## 2018-08-04 ENCOUNTER — Other Ambulatory Visit (INDEPENDENT_AMBULATORY_CARE_PROVIDER_SITE_OTHER): Payer: Medicare Other

## 2018-08-04 DIAGNOSIS — R7989 Other specified abnormal findings of blood chemistry: Secondary | ICD-10-CM

## 2018-08-04 LAB — COMPREHENSIVE METABOLIC PANEL
ALT: 20 U/L (ref 0–53)
AST: 25 U/L (ref 0–37)
Albumin: 3.7 g/dL (ref 3.5–5.2)
Alkaline Phosphatase: 53 U/L (ref 39–117)
BUN: 34 mg/dL — ABNORMAL HIGH (ref 6–23)
CO2: 28 mEq/L (ref 19–32)
Calcium: 9.7 mg/dL (ref 8.4–10.5)
Chloride: 106 mEq/L (ref 96–112)
Creatinine, Ser: 1.71 mg/dL — ABNORMAL HIGH (ref 0.40–1.50)
GFR: 40.01 mL/min — ABNORMAL LOW (ref >60.00)
Glucose, Bld: 94 mg/dL (ref 70–99)
Potassium: 4.6 mEq/L (ref 3.5–5.1)
SODIUM: 141 meq/L (ref 135–145)
Total Bilirubin: 1.1 mg/dL (ref 0.2–1.2)
Total Protein: 6.4 g/dL (ref 6.0–8.3)

## 2018-08-04 LAB — CBC WITH DIFFERENTIAL/PLATELET
BASOS ABS: 0 10*3/uL (ref 0.0–0.1)
BASOS PCT: 0.4 % (ref 0.0–3.0)
Eosinophils Absolute: 0.1 10*3/uL (ref 0.0–0.7)
Eosinophils Relative: 1.5 % (ref 0.0–5.0)
HCT: 43.7 % (ref 39.0–52.0)
Hemoglobin: 14.4 g/dL (ref 13.0–17.0)
Lymphocytes Relative: 13 % (ref 12.0–46.0)
Lymphs Abs: 0.9 10*3/uL (ref 0.7–4.0)
MCHC: 32.9 g/dL (ref 30.0–36.0)
MCV: 96.2 fl (ref 78.0–100.0)
Monocytes Absolute: 0.5 10*3/uL (ref 0.1–1.0)
Monocytes Relative: 7.1 % (ref 3.0–12.0)
NEUTROS PCT: 78 % — AB (ref 43.0–77.0)
Neutro Abs: 5.7 10*3/uL (ref 1.4–7.7)
Platelets: 161 10*3/uL (ref 150.0–400.0)
RBC: 4.54 Mil/uL (ref 4.22–5.81)
RDW: 15.2 % (ref 11.5–15.5)
WBC: 7.3 10*3/uL (ref 4.0–10.5)

## 2018-08-04 LAB — T4, FREE: Free T4: 0.95 ng/dL (ref 0.60–1.60)

## 2018-08-04 LAB — TSH: TSH: 6.22 u[IU]/mL — ABNORMAL HIGH (ref 0.35–4.50)

## 2018-08-08 ENCOUNTER — Encounter: Payer: Self-pay | Admitting: Family Medicine

## 2018-08-08 ENCOUNTER — Ambulatory Visit (INDEPENDENT_AMBULATORY_CARE_PROVIDER_SITE_OTHER): Payer: Medicare Other | Admitting: Family Medicine

## 2018-08-08 VITALS — BP 92/52 | Temp 97.5°F | Ht 69.0 in | Wt 133.2 lb

## 2018-08-08 DIAGNOSIS — R7989 Other specified abnormal findings of blood chemistry: Secondary | ICD-10-CM

## 2018-08-08 DIAGNOSIS — F4321 Adjustment disorder with depressed mood: Secondary | ICD-10-CM

## 2018-08-08 DIAGNOSIS — R197 Diarrhea, unspecified: Secondary | ICD-10-CM

## 2018-08-08 DIAGNOSIS — R42 Dizziness and giddiness: Secondary | ICD-10-CM | POA: Diagnosis not present

## 2018-08-08 LAB — BASIC METABOLIC PANEL
BUN: 37 mg/dL — ABNORMAL HIGH (ref 6–23)
CALCIUM: 9.7 mg/dL (ref 8.4–10.5)
CO2: 26 mEq/L (ref 19–32)
CREATININE: 1.61 mg/dL — AB (ref 0.40–1.50)
Chloride: 106 mEq/L (ref 96–112)
GFR: 42.89 mL/min — ABNORMAL LOW (ref >60.00)
Glucose, Bld: 145 mg/dL — ABNORMAL HIGH (ref 70–99)
Potassium: 4.3 mEq/L (ref 3.5–5.1)
Sodium: 141 mEq/L (ref 135–145)

## 2018-08-08 NOTE — Progress Notes (Signed)
He was a little lightheaded this AM.  Lower BP noted today.  He has been "slower to get moving" in the mornings.  Fatigue noted.    Weight is similar to prev year.  He may have been skipping some meals.  OJ and cheese toast for breakfast.    Death of his wife discussed.  He cared for her for years at home and he deserves a lot of credit for that..  This was a significant strain when she was alive and it is clearly a significant change now that she has passed.  Discussed.  He has grief as expected.  He is trying to work through that.  He does have family support.  He is here today with his son.  He has h/o EGD years ago.  Some recent dysphagia noted.  He may end up needing referral back to GI but this was tabled for now.  He has not yet started back on simvastatin so that is not a likely cause of his recent symptoms.  He still taking sertraline 50 mg a day.  He is still on Flomax at this point, along with iron and omeprazole.  He is still taking B12.  Previously with elevated creatinine noted.  Due for recheck labs.  See notes on labs.  Meds, vitals, and allergies reviewed.   ROS: Per HPI unless specifically indicated in ROS section   GEN: nad, alert and oriented, hard of hearing at baseline. HEENT: mucous membranes moist NECK: supple w/o LA CV: rrr. PULM: + UAN noted but otherwise Ctab, no inc wob ABD: soft, +bs EXT: no edema SKIN: no acute rash

## 2018-08-08 NOTE — Patient Instructions (Addendum)
Stay off simvastatin for now.  Stop the flomax for now.  Go to the lab on the way out.  We'll contact you with your lab report. Drink enough water to keep your urine clear and try to get solid 3 meals a day.  Okay to add some salt to food.   Take care.  Glad to see you.

## 2018-08-10 ENCOUNTER — Telehealth: Payer: Self-pay

## 2018-08-10 DIAGNOSIS — R7989 Other specified abnormal findings of blood chemistry: Secondary | ICD-10-CM | POA: Insufficient documentation

## 2018-08-10 DIAGNOSIS — F4321 Adjustment disorder with depressed mood: Secondary | ICD-10-CM | POA: Insufficient documentation

## 2018-08-10 DIAGNOSIS — R197 Diarrhea, unspecified: Secondary | ICD-10-CM | POA: Insufficient documentation

## 2018-08-10 DIAGNOSIS — R42 Dizziness and giddiness: Secondary | ICD-10-CM | POA: Insufficient documentation

## 2018-08-10 NOTE — Telephone Encounter (Signed)
See notes on labs. 

## 2018-08-10 NOTE — Telephone Encounter (Signed)
Ronnie Chan (DPR signed) request cb about 08/08/18 lab results on 08/11/18. FYI to Dr Damita Dunnings and Terri Skains CMA.

## 2018-08-10 NOTE — Assessment & Plan Note (Signed)
This was unexpected.  He was walking to the lab when he had an episode of diarrhea.  I do not think this was a primary issue of incontinence as much as it was diarrhea itself.  He got into the bathroom and his son was able to help clean him up and we did the best we could to provide a change of clothes with gown/shorts/booties/etc.  See notes on labs.

## 2018-08-10 NOTE — Assessment & Plan Note (Addendum)
Previously with elevated creatinine noted.  Due for recheck labs.  See notes on labs. >25 minutes spent in face to face time with patient, >50% spent in counselling or coordination of care.

## 2018-08-10 NOTE — Assessment & Plan Note (Signed)
Unclear how much this contributes to everything else.  Significant stressor noted with the death of his wife.  Discussed.  He has support from his son and other family members.  We talked about trying to get adequate caloric intake.  Family is helping with cooking.  I offered my condolences and he thanked me.  He deserves a lot of credit for all of the effort he put forth looking after his wife.  They were married for 23 years, with their 70th anniversary to have taken place this month.

## 2018-08-10 NOTE — Assessment & Plan Note (Signed)
His hemoglobin was recently normal.  His TSH was minimally elevated but his T4 was still normal.  Unclear to me if Flomax could be contributing to his lightheadedness.  He is not on simvastatin at this point so that is not likely to be a contributing factor.  Reasonable to stop Flomax at this point.  See notes on labs.  Recheck basic metabolic panel today.  Discussed.  Agreed.

## 2018-08-11 ENCOUNTER — Other Ambulatory Visit: Payer: Self-pay | Admitting: Family Medicine

## 2018-08-11 MED ORDER — SERTRALINE HCL 50 MG PO TABS: 75.0000 mg | ORAL_TABLET | Freq: Every day | ORAL | Status: AC

## 2018-08-15 NOTE — Telephone Encounter (Signed)
t's son Ronnie Chan (on dpr) returned Ronnie Chan's call from Friday.

## 2018-08-16 MED ORDER — MIRTAZAPINE 7.5 MG PO TABS: 7.5000 mg | ORAL_TABLET | Freq: Every day | ORAL | 2 refills | Status: AC

## 2018-08-16 NOTE — Telephone Encounter (Signed)
Patient's son notified as instructed by telephone and verbalized understanding. Ronnie Chan stated that his dad still have low energy and sleeping a lot. Ronnie Chan stated that his dad has no appetite and they were able to get one meal in him yesterday and hopes to get two meals in today. Patient's son wants to know if there is anything else they need to do now?

## 2018-08-16 NOTE — Telephone Encounter (Signed)
Detailed message with instructions left on Estill's voicemail per his request per our previous conversation. Rip Hawes in the message script has been sent to the pharmacy per Dr. Damita Dunnings.

## 2018-08-16 NOTE — Addendum Note (Signed)
Addended by: Tonia Ghent on: 08/16/2018 11:21 AM   Modules accepted: Orders

## 2018-08-16 NOTE — Telephone Encounter (Signed)
I really question if a lot of this is grief related.  I would try adding on mirtazapine at night to see if that helps with his mood and also with appetite.  I would add that and then have them update [place] in about 1 week.  If they note sig changes in the meantime, please call the clinic.  Thanks.   rx sent.

## 2018-08-20 ENCOUNTER — Emergency Department (HOSPITAL_COMMUNITY): Payer: Medicare Other

## 2018-08-20 ENCOUNTER — Other Ambulatory Visit: Payer: Self-pay

## 2018-08-20 ENCOUNTER — Inpatient Hospital Stay (HOSPITAL_COMMUNITY)
Admission: EM | Admit: 2018-08-20 | Discharge: 2018-08-24 | DRG: 871 | Disposition: E | Payer: Medicare Other | Attending: Internal Medicine | Admitting: Internal Medicine

## 2018-08-20 ENCOUNTER — Encounter (HOSPITAL_COMMUNITY): Payer: Self-pay | Admitting: Emergency Medicine

## 2018-08-20 DIAGNOSIS — R6520 Severe sepsis without septic shock: Secondary | ICD-10-CM | POA: Diagnosis not present

## 2018-08-20 DIAGNOSIS — N183 Chronic kidney disease, stage 3 unspecified: Secondary | ICD-10-CM | POA: Diagnosis present

## 2018-08-20 DIAGNOSIS — W19XXXA Unspecified fall, initial encounter: Secondary | ICD-10-CM | POA: Diagnosis not present

## 2018-08-20 DIAGNOSIS — H0589 Other disorders of orbit: Secondary | ICD-10-CM | POA: Diagnosis not present

## 2018-08-20 DIAGNOSIS — N179 Acute kidney failure, unspecified: Secondary | ICD-10-CM | POA: Diagnosis present

## 2018-08-20 DIAGNOSIS — I6782 Cerebral ischemia: Secondary | ICD-10-CM | POA: Diagnosis present

## 2018-08-20 DIAGNOSIS — Z8673 Personal history of transient ischemic attack (TIA), and cerebral infarction without residual deficits: Secondary | ICD-10-CM

## 2018-08-20 DIAGNOSIS — F0390 Unspecified dementia without behavioral disturbance: Secondary | ICD-10-CM | POA: Diagnosis present

## 2018-08-20 DIAGNOSIS — K219 Gastro-esophageal reflux disease without esophagitis: Secondary | ICD-10-CM | POA: Diagnosis present

## 2018-08-20 DIAGNOSIS — D509 Iron deficiency anemia, unspecified: Secondary | ICD-10-CM | POA: Diagnosis present

## 2018-08-20 DIAGNOSIS — G9341 Metabolic encephalopathy: Secondary | ICD-10-CM | POA: Diagnosis present

## 2018-08-20 DIAGNOSIS — Z9842 Cataract extraction status, left eye: Secondary | ICD-10-CM

## 2018-08-20 DIAGNOSIS — I517 Cardiomegaly: Secondary | ICD-10-CM | POA: Diagnosis present

## 2018-08-20 DIAGNOSIS — G459 Transient cerebral ischemic attack, unspecified: Secondary | ICD-10-CM | POA: Diagnosis present

## 2018-08-20 DIAGNOSIS — F329 Major depressive disorder, single episode, unspecified: Secondary | ICD-10-CM | POA: Diagnosis present

## 2018-08-20 DIAGNOSIS — M47812 Spondylosis without myelopathy or radiculopathy, cervical region: Secondary | ICD-10-CM | POA: Diagnosis present

## 2018-08-20 DIAGNOSIS — A419 Sepsis, unspecified organism: Secondary | ICD-10-CM | POA: Diagnosis not present

## 2018-08-20 DIAGNOSIS — R7989 Other specified abnormal findings of blood chemistry: Secondary | ICD-10-CM

## 2018-08-20 DIAGNOSIS — I447 Left bundle-branch block, unspecified: Secondary | ICD-10-CM | POA: Diagnosis present

## 2018-08-20 DIAGNOSIS — E7800 Pure hypercholesterolemia, unspecified: Secondary | ICD-10-CM | POA: Diagnosis not present

## 2018-08-20 DIAGNOSIS — I4891 Unspecified atrial fibrillation: Secondary | ICD-10-CM | POA: Diagnosis present

## 2018-08-20 DIAGNOSIS — Z8249 Family history of ischemic heart disease and other diseases of the circulatory system: Secondary | ICD-10-CM

## 2018-08-20 DIAGNOSIS — I214 Non-ST elevation (NSTEMI) myocardial infarction: Secondary | ICD-10-CM | POA: Diagnosis present

## 2018-08-20 DIAGNOSIS — R627 Adult failure to thrive: Secondary | ICD-10-CM | POA: Diagnosis present

## 2018-08-20 DIAGNOSIS — J181 Lobar pneumonia, unspecified organism: Secondary | ICD-10-CM | POA: Diagnosis present

## 2018-08-20 DIAGNOSIS — K922 Gastrointestinal hemorrhage, unspecified: Secondary | ICD-10-CM | POA: Diagnosis present

## 2018-08-20 DIAGNOSIS — Z66 Do not resuscitate: Secondary | ICD-10-CM | POA: Diagnosis present

## 2018-08-20 DIAGNOSIS — E872 Acidosis: Secondary | ICD-10-CM | POA: Diagnosis present

## 2018-08-20 DIAGNOSIS — R778 Other specified abnormalities of plasma proteins: Secondary | ICD-10-CM

## 2018-08-20 DIAGNOSIS — I4821 Permanent atrial fibrillation: Secondary | ICD-10-CM | POA: Diagnosis not present

## 2018-08-20 DIAGNOSIS — I252 Old myocardial infarction: Secondary | ICD-10-CM

## 2018-08-20 DIAGNOSIS — J189 Pneumonia, unspecified organism: Secondary | ICD-10-CM

## 2018-08-20 DIAGNOSIS — F32A Depression, unspecified: Secondary | ICD-10-CM | POA: Diagnosis present

## 2018-08-20 DIAGNOSIS — E440 Moderate protein-calorie malnutrition: Secondary | ICD-10-CM | POA: Diagnosis present

## 2018-08-20 DIAGNOSIS — Z515 Encounter for palliative care: Secondary | ICD-10-CM | POA: Diagnosis present

## 2018-08-20 DIAGNOSIS — Z9841 Cataract extraction status, right eye: Secondary | ICD-10-CM

## 2018-08-20 DIAGNOSIS — Z681 Body mass index (BMI) 19 or less, adult: Secondary | ICD-10-CM

## 2018-08-20 DIAGNOSIS — J9600 Acute respiratory failure, unspecified whether with hypoxia or hypercapnia: Secondary | ICD-10-CM | POA: Diagnosis not present

## 2018-08-20 DIAGNOSIS — Z7951 Long term (current) use of inhaled steroids: Secondary | ICD-10-CM

## 2018-08-20 DIAGNOSIS — J9601 Acute respiratory failure with hypoxia: Secondary | ICD-10-CM | POA: Diagnosis present

## 2018-08-20 LAB — BASIC METABOLIC PANEL
Anion gap: 15 (ref 5–15)
BUN: 66 mg/dL — ABNORMAL HIGH (ref 8–23)
CO2: 17 mmol/L — ABNORMAL LOW (ref 22–32)
Calcium: 9.6 mg/dL (ref 8.9–10.3)
Chloride: 109 mmol/L (ref 98–111)
Creatinine, Ser: 2.45 mg/dL — ABNORMAL HIGH (ref 0.61–1.24)
GFR calc Af Amer: 26 mL/min — ABNORMAL LOW (ref >60)
GFR calc non Af Amer: 22 mL/min — ABNORMAL LOW (ref >60)
Glucose, Bld: 122 mg/dL — ABNORMAL HIGH (ref 70–99)
Potassium: 4.6 mmol/L (ref 3.5–5.1)
Sodium: 141 mmol/L (ref 135–145)

## 2018-08-20 LAB — POC OCCULT BLOOD, ED: Fecal Occult Bld: NEGATIVE

## 2018-08-20 LAB — CBC WITH DIFFERENTIAL/PLATELET
Abs Immature Granulocytes: 0.04 10*3/uL (ref 0.00–0.07)
Abs Immature Granulocytes: 0.03 10*3/uL (ref 0.00–0.07)
BASOS ABS: 0 10*3/uL (ref 0.0–0.1)
BASOS PCT: 0 %
Basophils Absolute: 0 10*3/uL (ref 0.0–0.1)
Basophils Relative: 0 %
Eosinophils Absolute: 0 10*3/uL (ref 0.0–0.5)
Eosinophils Absolute: 0 10*3/uL (ref 0.0–0.5)
Eosinophils Relative: 0 %
Eosinophils Relative: 0 %
HCT: 49.7 % (ref 39.0–52.0)
HCT: 46.9 % (ref 39.0–52.0)
HEMOGLOBIN: 14.9 g/dL (ref 13.0–17.0)
Hemoglobin: 15.5 g/dL (ref 13.0–17.0)
Immature Granulocytes: 0 %
Immature Granulocytes: 0 %
Lymphocytes Relative: 7 %
Lymphocytes Relative: 6 %
Lymphs Abs: 0.8 10*3/uL (ref 0.7–4.0)
Lymphs Abs: 0.6 10*3/uL — ABNORMAL LOW (ref 0.7–4.0)
MCH: 31.1 pg (ref 26.0–34.0)
MCH: 32 pg (ref 26.0–34.0)
MCHC: 31.2 g/dL (ref 30.0–36.0)
MCHC: 31.8 g/dL (ref 30.0–36.0)
MCV: 99.8 fL (ref 80.0–100.0)
MCV: 100.6 fL — ABNORMAL HIGH (ref 80.0–100.0)
Monocytes Absolute: 0.7 10*3/uL (ref 0.1–1.0)
Monocytes Absolute: 0.6 10*3/uL (ref 0.1–1.0)
Monocytes Relative: 7 %
Monocytes Relative: 6 %
NRBC: 0.6 % — AB (ref 0.0–0.2)
Neutro Abs: 9.7 10*3/uL — ABNORMAL HIGH (ref 1.7–7.7)
Neutro Abs: 8.6 10*3/uL — ABNORMAL HIGH (ref 1.7–7.7)
Neutrophils Relative %: 86 %
Neutrophils Relative %: 88 %
PLATELETS: 146 10*3/uL — AB (ref 150–400)
Platelets: 144 10*3/uL — ABNORMAL LOW (ref 150–400)
RBC: 4.98 MIL/uL (ref 4.22–5.81)
RBC: 4.66 MIL/uL (ref 4.22–5.81)
RDW: 16 % — ABNORMAL HIGH (ref 11.5–15.5)
RDW: 15.8 % — ABNORMAL HIGH (ref 11.5–15.5)
WBC: 11.2 10*3/uL — AB (ref 4.0–10.5)
WBC: 9.9 10*3/uL (ref 4.0–10.5)
nRBC: 0.4 % — ABNORMAL HIGH (ref 0.0–0.2)

## 2018-08-20 LAB — I-STAT TROPONIN, ED: Troponin i, poc: 0.81 ng/mL (ref 0.00–0.08)

## 2018-08-20 LAB — I-STAT CG4 LACTIC ACID, ED
Lactic Acid, Venous: 3.55 mmol/L (ref 0.5–1.9)
Lactic Acid, Venous: 2.98 mmol/L (ref 0.5–1.9)

## 2018-08-20 LAB — PROTIME-INR
INR: 1.77
PROTHROMBIN TIME: 20.4 s — AB (ref 11.4–15.2)

## 2018-08-20 MED ORDER — ATORVASTATIN CALCIUM 80 MG PO TABS: 80.0000 mg | ORAL_TABLET | Freq: Every day | ORAL | Status: DC

## 2018-08-20 MED ORDER — SODIUM CHLORIDE 0.9 % IV SOLN
500.0000 mg | INTRAVENOUS | Status: DC
  Administered 2018-08-20: 500 mg via INTRAVENOUS
  Filled 2018-08-20: qty 500

## 2018-08-20 MED ORDER — PANTOPRAZOLE SODIUM 40 MG PO TBEC: 40.0000 mg | DELAYED_RELEASE_TABLET | Freq: Every day | ORAL | Status: DC

## 2018-08-20 MED ORDER — VITAMIN B-12 1000 MCG PO TABS: 1000.0000 ug | ORAL_TABLET | Freq: Every day | ORAL | Status: DC

## 2018-08-20 MED ORDER — HEPARIN SODIUM (PORCINE) 5000 UNIT/ML IJ SOLN: 5000.0000 [IU] | Freq: Three times a day (TID) | INTRAMUSCULAR | Status: DC

## 2018-08-20 MED ORDER — NITROGLYCERIN 0.4 MG SL SUBL: 0.4000 mg | SUBLINGUAL_TABLET | SUBLINGUAL | Status: DC | PRN

## 2018-08-20 MED ORDER — SERTRALINE HCL 50 MG PO TABS: 50.0000 mg | ORAL_TABLET | Freq: Every day | ORAL | Status: DC

## 2018-08-20 MED ORDER — SODIUM CHLORIDE 0.9 % IV SOLN
INTRAVENOUS | Status: DC
  Administered 2018-08-21: 01:00:00 via INTRAVENOUS

## 2018-08-20 MED ORDER — SODIUM CHLORIDE 0.9 % IV SOLN
2.0000 g | INTRAVENOUS | Status: DC
  Administered 2018-08-20: 2 g via INTRAVENOUS
  Filled 2018-08-20: qty 20

## 2018-08-20 MED ORDER — SODIUM CHLORIDE 0.9 % IV BOLUS (SEPSIS)
1000.0000 mL | Freq: Once | INTRAVENOUS | Status: AC
  Administered 2018-08-20: 1000 mL via INTRAVENOUS

## 2018-08-20 MED ORDER — MIRTAZAPINE 7.5 MG PO TABS
7.5000 mg | ORAL_TABLET | Freq: Every day | ORAL | Status: DC
  Filled 2018-08-20: qty 1

## 2018-08-20 MED ORDER — SODIUM CHLORIDE 0.9 % IV BOLUS
1000.0000 mL | Freq: Once | INTRAVENOUS | Status: AC
  Administered 2018-08-20: 1000 mL via INTRAVENOUS

## 2018-08-20 MED ORDER — FERROUS SULFATE 325 (65 FE) MG PO TBEC: 325.0000 mg | DELAYED_RELEASE_TABLET | Freq: Every day | ORAL | Status: DC

## 2018-08-20 MED ORDER — SODIUM CHLORIDE 0.9 % IV BOLUS (SEPSIS): 1000.0000 mL | Freq: Once | INTRAVENOUS | Status: DC

## 2018-08-20 MED ORDER — ASPIRIN EC 81 MG PO TBEC: 81.0000 mg | DELAYED_RELEASE_TABLET | Freq: Every day | ORAL | Status: DC

## 2018-08-20 MED ORDER — MORPHINE SULFATE (PF) 2 MG/ML IV SOLN: 0.5000 mg | INTRAVENOUS | Status: DC | PRN

## 2018-08-20 MED ORDER — ALBUTEROL SULFATE (2.5 MG/3ML) 0.083% IN NEBU: 2.5000 mg | INHALATION_SOLUTION | RESPIRATORY_TRACT | Status: DC | PRN

## 2018-08-20 MED ORDER — SODIUM CHLORIDE 0.9 % IV BOLUS
500.0000 mL | Freq: Once | INTRAVENOUS | Status: AC
  Administered 2018-08-21: 500 mL via INTRAVENOUS

## 2018-08-20 NOTE — ED Provider Notes (Signed)
[facility] EMERGENCY DEPARTMENT Provider Note   CSN: [id number] Arrival date & time: 08/06/2018  1716     History   Chief Complaint No chief complaint on file.   HPI Ronnie Chan is a 84 y.o. male.  He has a history of A. fib.  He is brought in by family for evaluation of a fall.  Family states he was just coming in from the porch when he fell to the ground.  There is no loss of consciousness.  They noticed that his blood pressure was low and so called 911 for transport here.  They said he has been declining over the last few weeks being generally weak all over and not eating much.  They also noted he had had 2 dark stools yesterday and today.  He has a history of having a GI bleed per the son and they gave him some blood but did not ever end up finding a source for the bleeding.  He is also on a new medication for depression.  Patient himself denies any complaints. Level 5 caviate secondary to dementia  The history is provided by the patient and a relative.  Fall  This is a new problem. The current episode started 1 to 2 hours ago. The problem occurs constantly. The problem has not changed since onset.Pertinent negatives include no chest pain, no abdominal pain, no headaches and no shortness of breath. Nothing aggravates the symptoms. Nothing relieves the symptoms. He has tried nothing for the symptoms. The treatment provided no relief.    Past Medical History:  Diagnosis Date  . Atrial fibrillation [facility]) summer 2017   Eliquis initiated.   . B12 deficiency   . CKD (chronic kidney disease)    stage 3 in 03/2015  . Diverticulitis of colon with bleeding 03/11   Hospital 03/25-03/29/11, ARF due dehydr, acute blood loss anemia due divertic bleed  . History of CT scan of abdomen 11/29/09   No acute process, large R parapelvic renal cyst calcull  . History of ETT 11/24/09   myoview, nml EF 48%  . history of mini stroke 92   Dr. Earley Favor  . Lower GI bleed 04/11   acute blood loss anemia, transfusion 4 units PRBC's, non stemi    Patient Active Problem List   Diagnosis Date Noted  . Elevated serum creatinine 08/10/2018  . Lightheaded 08/10/2018  . Grief 08/10/2018  . Diarrhea 08/10/2018  . Fatigue 08/03/2018  . Urinary frequency 08/03/2018  . Anemia, iron deficiency   . Occult blood in stools   . Absolute anemia   . Pain in the chest   . Bleeding gastrointestinal   . Anemia 11/12/2015  . Atrial fibrillation ([place]) 04/05/2015  . Irregular heart beat 03/27/2015  . Pain in joint, shoulder region 02/25/2015  . Cough 01/24/2015  . Medicare annual wellness visit, subsequent 07/29/2014  . Advance directive discussed with patient 05/18/2012  . Hypercholesteremia 04/16/2011  . ANOREXIA 01/14/2010  . DIVERTICULOSIS, COLON, WITH HEMORRHAGE 11/27/2009  . B12 deficiency 01/17/2007  . GERD 01/17/2007    Past Surgical History:  Procedure Laterality Date  . CATARACT EXTRACTION, BILATERAL  09/1997- 10/1997  . COLONOSCOPY N/A 11/15/2015   Procedure: COLONOSCOPY;  Surgeon: Ladene Artist, MD;  Location: [facility] ENDOSCOPY;  Service: Endoscopy;  Laterality: N/A;  . ESOPHAGOGASTRODUODENOSCOPY N/A 11/15/2015   Procedure: ESOPHAGOGASTRODUODENOSCOPY (EGD);  Surgeon: Ladene Artist, MD;  Location[facility] ENDOSCOPY;  Service: Endoscopy;  Laterality: N/A;  . SEPTOPLASTY  1990  Dr. Ernesto Rutherford, left  . TRACHEOSTOMY  7 yoa   Strep throat        Home Medications    Prior to Admission medications   Medication Sig Start Date End Date Taking? Authorizing Provider  ferrous sulfate 325 (65 FE) MG EC tablet Take 325 mg by mouth daily with breakfast.    [provider]  mirtazapine (REMERON) 7.5 MG tablet Take 1 tablet (7.5 mg total) by mouth at bedtime. 08/16/18   Tonia Ghent, MD  omeprazole (PRILOSEC) 10 MG capsule Take 1 capsule by mouth daily. 05/12/18   [provider]  sertraline (ZOLOFT) 50 MG tablet Take 1.5 tablets (75 mg total) by mouth daily.  08/11/18   Tonia Ghent, MD  tamsulosin (FLOMAX) 0.4 MG CAPS capsule TAKE 1 CAPSULE (0.4 MG TOTAL) BY MOUTH DAILY AFTER BREAKFAST. 05/26/18   Tonia Ghent, MD  vitamin B-12 (CYANOCOBALAMIN) 1000 MCG tablet Take 1 tablet (1,000 mcg total) by mouth daily. 12/20/17   Tonia Ghent, MD    Family History Family History  Problem Relation Age of Onset  . Heart disease Father        CHF  . Heart disease Paternal Grandfather        heart attacks    Social History Social History   Tobacco Use  . Smoking status: Never Smoker  . Smokeless tobacco: Never Used  Substance Use Topics  . Alcohol use: No    Alcohol/week: 0.0 standard drinks  . Drug use: No     Allergies   Lisinopril and Sulfadiazine   Review of Systems Review of Systems  Constitutional: Negative for fever.  HENT: Negative for sore throat.   Eyes: Negative for visual disturbance.  Respiratory: Negative for shortness of breath.   Cardiovascular: Negative for chest pain.  Gastrointestinal: Positive for blood in stool (?). Negative for abdominal pain and vomiting.  Genitourinary: Negative for dysuria.  Musculoskeletal: Negative for neck pain.  Skin: Negative for rash.  Neurological: Positive for speech difficulty (son thinks it is off for a few days). Negative for headaches.     Physical Exam Updated Vital Signs BP 109/77 (BP Location: Right Arm)   Pulse (!) 106   Temp (!) 97.4 F (36.3 C) (Axillary)   Resp 15   Ht 5\' 9"  (1.753 m)   Wt 60.4 kg   SpO2 97%   BMI 19.66 kg/m   Physical Exam Vitals signs and nursing note reviewed.  Constitutional:      Appearance: He is well-developed.  HENT:     Head: Normocephalic and atraumatic.  Eyes:     Conjunctiva/sclera: Conjunctivae normal.  Neck:     Musculoskeletal: No muscular tenderness.  Cardiovascular:     Rate and Rhythm: Normal rate and regular rhythm.     Heart sounds: No murmur.  Pulmonary:     Effort: Pulmonary effort is normal. No  respiratory distress.     Breath sounds: Normal breath sounds.  Abdominal:     Palpations: Abdomen is soft.     Tenderness: There is no abdominal tenderness.  Musculoskeletal:        General: Tenderness (old r shoulder problems) present. No deformity or signs of injury.     Right lower leg: Edema present.     Left lower leg: Edema present.  Skin:    General: Skin is warm and dry.     Capillary Refill: Capillary refill takes less than 2 seconds.  Neurological:     General: No  focal deficit present.     Mental Status: He is alert.     Motor: No weakness (no focal).      ED Treatments / Results  Labs (all labs ordered are listed, but only abnormal results are displayed) Labs Reviewed  CBC WITH DIFFERENTIAL/PLATELET - Abnormal; Notable for the following components:      Result Value   WBC 11.2 (*)    RDW 16.0 (*)    Platelets 146 (*)    nRBC 0.6 (*)    Neutro Abs 9.7 (*)    All other components within normal limits  PROTIME-INR - Abnormal; Notable for the following components:   Prothrombin Time 20.4 (*)    All other components within normal limits  BASIC METABOLIC PANEL - Abnormal; Notable for the following components:   CO2 17 (*)    Glucose, Bld 122 (*)    BUN 66 (*)    Creatinine, Ser 2.45 (*)    GFR calc non Af Amer 22 (*)    GFR calc Af Amer 26 (*)    All other components within normal limits  URINALYSIS, ROUTINE W REFLEX MICROSCOPIC - Abnormal; Notable for the following components:   Color, Urine AMBER (*)    APPearance HAZY (*)    Protein, ur 30 (*)    Leukocytes, UA TRACE (*)    Bacteria, UA RARE (*)    All other components within normal limits  CBC WITH DIFFERENTIAL/PLATELET - Abnormal; Notable for the following components:   MCV 100.6 (*)    RDW 15.8 (*)    Platelets 144 (*)    nRBC 0.4 (*)    Neutro Abs 8.6 (*)    Lymphs Abs 0.6 (*)    All other components within normal limits  LACTIC ACID, PLASMA - Abnormal; Notable for the following components:    Lactic Acid, Venous 3.1 (*)    All other components within normal limits  LACTIC ACID, PLASMA - Abnormal; Notable for the following components:   Lactic Acid, Venous 5.1 (*)    All other components within normal limits  TROPONIN I - Abnormal; Notable for the following components:   Troponin I 1.15 (*)    All other components within normal limits  I-STAT TROPONIN, ED - Abnormal; Notable for the following components:   Troponin i, poc 0.81 (*)    All other components within normal limits  I-STAT CG4 LACTIC ACID, ED - Abnormal; Notable for the following components:   Lactic Acid, Venous 3.55 (*)    All other components within normal limits  I-STAT CG4 LACTIC ACID, ED - Abnormal; Notable for the following components:   Lactic Acid, Venous 2.98 (*)    All other components within normal limits  CULTURE, BLOOD (ROUTINE X 2)  CULTURE, BLOOD (ROUTINE X 2)  PROCALCITONIN  STREP PNEUMONIAE URINARY ANTIGEN  LEGIONELLA PNEUMOPHILA SEROGP 1 UR AG  POC OCCULT BLOOD, ED    EKG EKG Interpretation  Date/Time:  Saturday August 20 2018 17:38:35 EST Ventricular Rate:  94 PR Interval:    QRS Duration: 124 QT Interval:  374 QTC Calculation: 468 R Axis:   72 Text Interpretation:  Atrial fibrillation Ventricular premature complex LVH with secondary repolarization abnormality Anterior infarct, old new LLLB pattern compared with prior 3/17 Confirmed by Aletta Edouard [id number]) on 08/08/2018 7:00:25 PM   Radiology Dg Chest 1 View  Result Date: 08/08/2018 CLINICAL DATA:  Shortness of breath. EXAM: CHEST  1 VIEW COMPARISON:  Radiographs of November 12, 2015. FINDINGS: Stable  cardiomegaly. Atherosclerosis of thoracic aorta is noted. No pneumothorax is noted. New bilateral perihilar and basilar opacities are noted concerning for edema or possibly pneumonia with small pleural effusions. Bony thorax is unremarkable. IMPRESSION: New bilateral perihilar and basilar opacities are noted concerning for edema or  pneumonia with small pleural effusions. Follow-up radiographs are recommended to ensure resolution and rule out underlying mass or neoplasm. Aortic Atherosclerosis (ICD10-I70.0). Electronically Signed   By: Marijo Conception, M.D.   On: 07/30/2018 18:53   Ct Head Wo Contrast  Result Date: 08/17/2018 CLINICAL DATA:  Fall.  Syncope.  Initial encounter. EXAM: CT HEAD WITHOUT CONTRAST CT CERVICAL SPINE WITHOUT CONTRAST TECHNIQUE: Multidetector CT imaging of the head and cervical spine was performed following the standard protocol without intravenous contrast. Multiplanar CT image reconstructions of the cervical spine were also generated. COMPARISON:  None. FINDINGS: CT HEAD FINDINGS Brain: There is no evidence of acute large territory infarct, intracranial hemorrhage, mass, midline shift, or extra-axial fluid collection. Small infarcts in the left greater than right basal ganglia/corona radiata and right cerebellum are favored to be chronic. There is mild-to-moderate cerebral atrophy. Patchy cerebral white matter hypodensities are nonspecific but compatible with mild-to-moderate chronic small-vessel ischemic disease. Vascular: Calcified atherosclerosis at the skull base. No hyperdense vessel. Skull: No fracture or suspicious osseous lesion. Sinuses/Orbits: Paranasal sinuses and mastoid air cells are clear. 15 x 8 mm intraconal mass in the posterior, superior right orbit. Bilateral cataract extraction. Other: None. CT CERVICAL SPINE FINDINGS Alignment: Trace anterolisthesis of C4 on C5 and C7 on T1, likely degenerative. Skull base and vertebrae: No acute fracture or destructive osseous lesion. 6 mm sclerotic focus in the left pars at T2, possibly a bone island. Soft tissues and spinal canal: No prevertebral fluid or swelling. No visible canal hematoma. Disc levels:  Mild for age cervical spondylosis and facet arthrosis. Upper chest: Mild biapical lung scarring. Other: Moderate calcified plaque about the right greater  than left carotid bifurcations. IMPRESSION: 1. No evidence of acute intracranial injury or acute cervical spine fracture. 2. Mild-to-moderate chronic small vessel ischemic disease with likely chronic lacunar infarcts in the basal ganglia and cerebellum. 3. 15 mm intraconal right orbital mass. Consider nonemergent MRI of the orbits without and with contrast for further evaluation. Electronically Signed   By: Logan Bores M.D.   On: 07/24/2018 21:03   Ct Cervical Spine Wo Contrast  Result Date: 08/05/2018 CLINICAL DATA:  Fall.  Syncope.  Initial encounter. EXAM: CT HEAD WITHOUT CONTRAST CT CERVICAL SPINE WITHOUT CONTRAST TECHNIQUE: Multidetector CT imaging of the head and cervical spine was performed following the standard protocol without intravenous contrast. Multiplanar CT image reconstructions of the cervical spine were also generated. COMPARISON:  None. FINDINGS: CT HEAD FINDINGS Brain: There is no evidence of acute large territory infarct, intracranial hemorrhage, mass, midline shift, or extra-axial fluid collection. Small infarcts in the left greater than right basal ganglia/corona radiata and right cerebellum are favored to be chronic. There is mild-to-moderate cerebral atrophy. Patchy cerebral white matter hypodensities are nonspecific but compatible with mild-to-moderate chronic small-vessel ischemic disease. Vascular: Calcified atherosclerosis at the skull base. No hyperdense vessel. Skull: No fracture or suspicious osseous lesion. Sinuses/Orbits: Paranasal sinuses and mastoid air cells are clear. 15 x 8 mm intraconal mass in the posterior, superior right orbit. Bilateral cataract extraction. Other: None. CT CERVICAL SPINE FINDINGS Alignment: Trace anterolisthesis of C4 on C5 and C7 on T1, likely degenerative. Skull base and vertebrae: No acute fracture or destructive osseous lesion. 6 mm  sclerotic focus in the left pars at T2, possibly a bone island. Soft tissues and spinal canal: No prevertebral  fluid or swelling. No visible canal hematoma. Disc levels:  Mild for age cervical spondylosis and facet arthrosis. Upper chest: Mild biapical lung scarring. Other: Moderate calcified plaque about the right greater than left carotid bifurcations. IMPRESSION: 1. No evidence of acute intracranial injury or acute cervical spine fracture. 2. Mild-to-moderate chronic small vessel ischemic disease with likely chronic lacunar infarcts in the basal ganglia and cerebellum. 3. 15 mm intraconal right orbital mass. Consider nonemergent MRI of the orbits without and with contrast for further evaluation. Electronically Signed   By: Logan Bores M.D.   On: 07/29/2018 21:03    Procedures .Critical Care Performed by: Hayden Rasmussen, MD Authorized by: Hayden Rasmussen, MD   Critical care provider statement:    Critical care time (minutes):  45   Critical care was necessary to treat or prevent imminent or life-threatening deterioration of the following conditions:  Cardiac failure and sepsis   Critical care was time spent personally by me on the following activities:  Discussions with consultants, evaluation of patient's response to treatment, examination of patient, ordering and performing treatments and interventions, ordering and review of laboratory studies, ordering and review of radiographic studies, pulse oximetry, re-evaluation of patient's condition, obtaining history from patient or surrogate, review of old charts and development of treatment plan with patient or surrogate   I assumed direction of critical care for this patient from another provider in my specialty: no     (including critical care time)  Medications Ordered in ED Medications  LORazepam (ATIVAN) 2 MG/ML concentrated solution 2 mg (2 mg Sublingual Given 09/10/18 0347)  morphine CONCENTRATE 10 MG/0.5ML oral solution 10 mg (10 mg Sublingual Given 09/10/18 0256)  scopolamine (TRANSDERM-SCOP) 1 MG/3DAYS 1.5 mg (1.5 mg Transdermal Patch  Applied 09/10/18 0346)  naphazoline-glycerin (CLEAR EYES REDNESS) ophth solution 1-2 drop (has no administration in time range)  morphine 2 MG/ML injection 2 mg (2 mg Intravenous Given 09/10/18 0640)  sodium chloride 0.9 % bolus 1,000 mL (0 mLs Intravenous Stopped 08/02/2018 2017)  sodium chloride 0.9 % bolus 1,000 mL (0 mLs Intravenous Stopped 08/11/2018 2134)  sodium chloride 0.9 % bolus 500 mL (0 mLs Intravenous Stopped 10-Sep-2018 0155)     Initial Impression / Assessment and Plan / ED Course  I have reviewed the triage vital signs and the nursing notes.  Pertinent labs & imaging results that were available during my care of the patient were reviewed by me and considered in my medical decision making (see chart for details).  Clinical Course as of Aug 21 1305  Sat Aug 20, 2018  1858 Patient lactate and troponin both elevated.  Have activated him as a code sepsis and of starting antibiotics for presumptive pneumonia by chest x-ray.   [MB]  1941 Patient's creatinine bumped from his baseline.  Fluids are infusing now along with antibiotics.  We will do a rectal exam.  His INR is also up and I do not believe he is on anticoagulation.   [MB]  2007 Discussed with cardiology fellow Dr. Rhae Hammock who felt that this is probably not an acute cardiac event.  He asked if the admitting team needs a formal consult to reconsult him.   [MB]  2156 Discussed with Dr. new from the hospitalist service who will accept the patient for admission to his service.   [MB]    Clinical Course User Index [MB] Melina Copa,  Rebeca Alert, MD    Final Clinical Impressions(s) / ED Diagnoses   Final diagnoses:  Sepsis, due to unspecified organism, unspecified whether acute organ dysfunction present ([place])  Elevated lactic acid level  Elevated troponin  Community acquired pneumonia, unspecified laterality    ED Discharge Orders    None       Hayden Rasmussen, MD September 11, 2018 1308

## 2018-08-20 NOTE — H&P (Addendum)
History and Physical    Ronnie Chan HEN:[id number] DOB: [birth year] DOA: 08/16/2018  Referring MD/NP/PA:   PCP: Tonia Ghent, MD   Patient coming from:  The patient is coming from home.  At baseline, pt is independent for most of ADL.        Chief Complaint: Fall  HPI: Ronnie Chan is a 84 y.o. male with medical history significant of TIA, hyperlipidemia, GERD, depression, atrial fibrillation not on anticoagulants, iron deficiency anemia, CKD-3, lower GI bleeding, dementia, who presents with fall.  Per patient's son, patient fell when he was walking to the bathroom at home at about 5 PM.  Patient likely did not lose consciousness per his son.  Patient has been declining recently.  He has generalized weakness, dizziness, mild confusion, talking and clearly.  Patient does not have vision change, hearing loss, facial droop, unilateral numbness or tingling his extremities.  Patient may has mild chest discomfort, but no CP.  He reports shortness of breath on exertion.  He has cough with little yellow-colored sputum production. Denies symptoms of UTI.  Patient has had 2 large dark loose stool bowel movement since yesterday.  Currently no nausea, vomiting, diarrhea or abdominal pain. When I initially saw pt in ED, he was alter, oriented x3.  Addendum: Later on, his mental status has worsened, become confused, not oriented x3. Initially he had negative FOBT, but developed rectal bleeding in ED which is before IV heparin was started (IV heparin was not started at all). His trop is up 0.8-->1.15. Family decided for comfort care. Bed is changed to Med-surg.  ED Course: pt was found to have WBC 11.2, troponin 0.8-->1.15, lactic acid 3.55, 2.98, INR 1.77, negative FOBT, worsening renal function, temperature normal, tachycardia, tachypnea, oxygen saturation 90% on room air, chest x-ray showed bilateral perihilar and bibasilar infiltration.  CT of C-spine is negative for acute bony fracture.  CT of  head is negative for acute intracranial abnormalities, and showed 15 mm of intraconal right orbital mass.  Patient is admitted to telemetry bed as inpatient.  Review of Systems:   General: no fevers, chills, no body weight gain, has fatigue HEENT: no blurry vision, hearing changes or sore throat Respiratory: has dyspnea, coughing, no wheezing CV: has chest discomfort, no palpitations GI: no nausea, vomiting, abdominal pain, had loose stool, no constipation.  Has rectal bleeding GU: no dysuria, burning on urination, increased urinary frequency, hematuria this is changed Ext: no leg edema Neuro: no unilateral weakness, numbness, or tingling, no vision change or hearing loss. Had fall. Skin: no rash, no skin tear. MSK: No muscle spasm, no deformity, no limitation of range of movement in spin Heme: No easy bruising.  Travel history: No recent long distant travel.  Allergy:  Allergies  Allergen Reactions  . Tape Other (See Comments)    SKIN IS VERY THIN AND WILL TEAR AND BRUISE VERY EASILY!!  . Lisinopril Other (See Comments)    Insomnia   . Sulfadiazine Other (See Comments)    Unknown reaction    Past Medical History:  Diagnosis Date  . Atrial fibrillation [facility]) summer 2017   Eliquis initiated.   . B12 deficiency   . CKD (chronic kidney disease)    stage 3 in 03/2015  . Diverticulitis of colon with bleeding 03/11   Hospital 03/25-03/29/11, ARF due dehydr, acute blood loss anemia due divertic bleed  . History of CT scan of abdomen 11/29/09   No acute process, large R parapelvic renal cyst calcull  .  History of ETT 11/24/09   myoview, nml EF 48%  . history of mini stroke 84   Dr. Earley Favor  . Lower GI bleed 04/11    acute blood loss anemia, transfusion 4 units PRBC's, non stemi    Past Surgical History:  Procedure Laterality Date  . CATARACT EXTRACTION, BILATERAL  09/1997- 10/1997  . COLONOSCOPY N/A 11/15/2015   Procedure: COLONOSCOPY;  Surgeon: Ladene Artist, MD;   Location: [facility] ENDOSCOPY;  Service: Endoscopy;  Laterality: N/A;  . ESOPHAGOGASTRODUODENOSCOPY N/A 11/15/2015   Procedure: ESOPHAGOGASTRODUODENOSCOPY (EGD);  Surgeon: Ladene Artist, MD;  Location: [facility] ENDOSCOPY;  Service: Endoscopy;  Laterality: N/A;  . SEPTOPLASTY  1990   Dr. Ernesto Rutherford, left  . TRACHEOSTOMY  7 yoa   Strep throat    Social History:  reports that he has never smoked. He has never used smokeless tobacco. He reports that he does not drink alcohol or use drugs.  Family History:  Family History  Problem Relation Age of Onset  . Heart disease Father        CHF  . Heart disease Paternal Grandfather        heart attacks     Prior to Admission medications   Medication Sig Start Date End Date Taking? Authorizing Provider  ferrous sulfate 325 (65 FE) MG EC tablet Take 325 mg by mouth daily with breakfast.    [provider]  mirtazapine (REMERON) 7.5 MG tablet Take 1 tablet (7.5 mg total) by mouth at bedtime. 08/16/18   Tonia Ghent, MD  omeprazole (PRILOSEC) 10 MG capsule Take 1 capsule by mouth daily. 05/12/18   [provider]  sertraline (ZOLOFT) 50 MG tablet Take 1.5 tablets (75 mg total) by mouth daily. 08/11/18   Tonia Ghent, MD  tamsulosin (FLOMAX) 0.4 MG CAPS capsule TAKE 1 CAPSULE (0.4 MG TOTAL) BY MOUTH DAILY AFTER BREAKFAST. 05/26/18   Tonia Ghent, MD  vitamin B-12 (CYANOCOBALAMIN) 1000 MCG tablet Take 1 tablet (1,000 mcg total) by mouth daily. 12/20/17   Tonia Ghent, MD    Physical Exam: Vitals:   08/22/2018 2215 08/06/2018 2330 09-13-2018 0015 Sep 13, 2018 0030  BP: 104/75 (!) 111/94 111/71 (!) 114/59  Pulse:   (!) 34 (!) 46  Resp: (!) 22 (!) 22 (!) 27 17  Temp:      TempSrc:      SpO2:   94% 99%  Weight:      Height:       General: Not in acute distress. Very thin body habitus. HEENT:       Eyes: PERRL, EOMI, no scleral icterus.       ENT: No discharge from the ears and nose, no pharynx injection, no tonsillar enlargement.         Neck: No JVD, no bruit, no mass felt. Heme: No neck lymph node enlargement. Cardiac: S1/S2, RRR, No murmurs, No gallops or rubs. Respiratory: No rales, wheezing, rhonchi or rubs. GI: Soft, nondistended, nontender, no rebound pain, no organomegaly, BS present. GU: No hematuria Ext: No pitting leg edema bilaterally. 2+DP/PT pulse bilaterally. Musculoskeletal: No joint deformities, No joint redness or warmth, no limitation of ROM in spin. Skin: No rashes.  Neuro: Confused, not oriented x3, cranial nerves II-XII grossly intact, moves all extremities. Psych: Patient is not psychotic, no suicidal or hemocidal ideation.  Labs on Admission: I have personally reviewed following labs and imaging studies  CBC: Recent Labs  Lab 07/25/2018 1740 08/11/2018 2253  WBC 11.2* 9.9  NEUTROABS 9.7*  8.6*  HGB 15.5 14.9  HCT 49.7 46.9  MCV 99.8 100.6*  PLT 146* 500*   Basic Metabolic Panel: Recent Labs  Lab 07/29/2018 1740  NA 141  K 4.6  CL 109  CO2 17*  GLUCOSE 122*  BUN 66*  CREATININE 2.45*  CALCIUM 9.6   GFR: Estimated Creatinine Clearance: 16.8 mL/min (A) (by C-G formula based on SCr of 2.45 mg/dL (H)). Liver Function Tests: No results for input(s): AST, ALT, ALKPHOS, BILITOT, PROT, ALBUMIN in the last 168 hours. No results for input(s): LIPASE, AMYLASE in the last 168 hours. No results for input(s): AMMONIA in the last 168 hours. Coagulation Profile: Recent Labs  Lab 08/11/2018 1740  INR 1.77   Cardiac Enzymes: Recent Labs  Lab 07/30/2018 2253  TROPONINI 1.15*   BNP (last 3 results) No results for input(s): PROBNP in the last 8760 hours. HbA1C: No results for input(s): HGBA1C in the last 72 hours. CBG: No results for input(s): GLUCAP in the last 168 hours. Lipid Profile: No results for input(s): CHOL, HDL, LDLCALC, TRIG, CHOLHDL, LDLDIRECT in the last 72 hours. Thyroid Function Tests: No results for input(s): TSH, T4TOTAL, FREET4, T3FREE, THYROIDAB in the last 72  hours. Anemia Panel: No results for input(s): VITAMINB12, FOLATE, FERRITIN, TIBC, IRON, RETICCTPCT in the last 72 hours. Urine analysis:    Component Value Date/Time   COLORURINE AMBER (A) 08/08/2018 2355   APPEARANCEUR HAZY (A) 08/23/2018 2355   LABSPEC 1.018 08/13/2018 2355   PHURINE 5.0 08/06/2018 2355   GLUCOSEU NEGATIVE 08/05/2018 2355   HGBUR NEGATIVE 08/14/2018 2355   BILIRUBINUR NEGATIVE 07/27/2018 2355   BILIRUBINUR Negative 08/03/2018 [place] 08/16/2018 2355   PROTEINUR 30 (A) 08/11/2018 2355   UROBILINOGEN 0.2 08/03/2018 1715   UROBILINOGEN 1.0 04/18/2014 1155   NITRITE NEGATIVE 08/11/2018 2355   LEUKOCYTESUR TRACE (A) 08/15/2018 2355   Sepsis Labs: @LABRCNTIP (procalcitonin:4,lacticidven:4) )No results found for this or any previous visit (from the past 240 hour(s)).   Radiological Exams on Admission: Dg Chest 1 View  Result Date: 08/03/2018 CLINICAL DATA:  Shortness of breath. EXAM: CHEST  1 VIEW COMPARISON:  Radiographs of November 12, 2015. FINDINGS: Stable cardiomegaly. Atherosclerosis of thoracic aorta is noted. No pneumothorax is noted. New bilateral perihilar and basilar opacities are noted concerning for edema or possibly pneumonia with small pleural effusions. Bony thorax is unremarkable. IMPRESSION: New bilateral perihilar and basilar opacities are noted concerning for edema or pneumonia with small pleural effusions. Follow-up radiographs are recommended to ensure resolution and rule out underlying mass or neoplasm. Aortic Atherosclerosis (ICD10-I70.0). Electronically Signed   By: Marijo Conception, M.D.   On: 08/12/2018 18:53   Ct Head Wo Contrast  Result Date: 07/31/2018 CLINICAL DATA:  Fall.  Syncope.  Initial encounter. EXAM: CT HEAD WITHOUT CONTRAST CT CERVICAL SPINE WITHOUT CONTRAST TECHNIQUE: Multidetector CT imaging of the head and cervical spine was performed following the standard protocol without intravenous contrast. Multiplanar CT image  reconstructions of the cervical spine were also generated. COMPARISON:  None. FINDINGS: CT HEAD FINDINGS Brain: There is no evidence of acute large territory infarct, intracranial hemorrhage, mass, midline shift, or extra-axial fluid collection. Small infarcts in the left greater than right basal ganglia/corona radiata and right cerebellum are favored to be chronic. There is mild-to-moderate cerebral atrophy. Patchy cerebral white matter hypodensities are nonspecific but compatible with mild-to-moderate chronic small-vessel ischemic disease. Vascular: Calcified atherosclerosis at the skull base. No hyperdense vessel. Skull: No fracture or suspicious osseous lesion. Sinuses/Orbits: Paranasal  sinuses and mastoid air cells are clear. 15 x 8 mm intraconal mass in the posterior, superior right orbit. Bilateral cataract extraction. Other: None. CT CERVICAL SPINE FINDINGS Alignment: Trace anterolisthesis of C4 on C5 and C7 on T1, likely degenerative. Skull base and vertebrae: No acute fracture or destructive osseous lesion. 6 mm sclerotic focus in the left pars at T2, possibly a bone island. Soft tissues and spinal canal: No prevertebral fluid or swelling. No visible canal hematoma. Disc levels:  Mild for age cervical spondylosis and facet arthrosis. Upper chest: Mild biapical lung scarring. Other: Moderate calcified plaque about the right greater than left carotid bifurcations. IMPRESSION: 1. No evidence of acute intracranial injury or acute cervical spine fracture. 2. Mild-to-moderate chronic small vessel ischemic disease with likely chronic lacunar infarcts in the basal ganglia and cerebellum. 3. 15 mm intraconal right orbital mass. Consider nonemergent MRI of the orbits without and with contrast for further evaluation. Electronically Signed   By: Logan Bores M.D.   On: 08/09/2018 21:03   Ct Cervical Spine Wo Contrast  Result Date: 08/03/2018 CLINICAL DATA:  Fall.  Syncope.  Initial encounter. EXAM: CT HEAD  WITHOUT CONTRAST CT CERVICAL SPINE WITHOUT CONTRAST TECHNIQUE: Multidetector CT imaging of the head and cervical spine was performed following the standard protocol without intravenous contrast. Multiplanar CT image reconstructions of the cervical spine were also generated. COMPARISON:  None. FINDINGS: CT HEAD FINDINGS Brain: There is no evidence of acute large territory infarct, intracranial hemorrhage, mass, midline shift, or extra-axial fluid collection. Small infarcts in the left greater than right basal ganglia/corona radiata and right cerebellum are favored to be chronic. There is mild-to-moderate cerebral atrophy. Patchy cerebral white matter hypodensities are nonspecific but compatible with mild-to-moderate chronic small-vessel ischemic disease. Vascular: Calcified atherosclerosis at the skull base. No hyperdense vessel. Skull: No fracture or suspicious osseous lesion. Sinuses/Orbits: Paranasal sinuses and mastoid air cells are clear. 15 x 8 mm intraconal mass in the posterior, superior right orbit. Bilateral cataract extraction. Other: None. CT CERVICAL SPINE FINDINGS Alignment: Trace anterolisthesis of C4 on C5 and C7 on T1, likely degenerative. Skull base and vertebrae: No acute fracture or destructive osseous lesion. 6 mm sclerotic focus in the left pars at T2, possibly a bone island. Soft tissues and spinal canal: No prevertebral fluid or swelling. No visible canal hematoma. Disc levels:  Mild for age cervical spondylosis and facet arthrosis. Upper chest: Mild biapical lung scarring. Other: Moderate calcified plaque about the right greater than left carotid bifurcations. IMPRESSION: 1. No evidence of acute intracranial injury or acute cervical spine fracture. 2. Mild-to-moderate chronic small vessel ischemic disease with likely chronic lacunar infarcts in the basal ganglia and cerebellum. 3. 15 mm intraconal right orbital mass. Consider nonemergent MRI of the orbits without and with contrast for further  evaluation. Electronically Signed   By: Logan Bores M.D.   On: 08/20/2018 21:03     EKG: Independently reviewed.  Atrial fibrillation, QTc 468, poor R wave progression, left bundle blockage which seem to be new.  Assessment/Plan Principal Problem:   Comfort measures only status Active Problems:   GERD   Hypercholesteremia   Atrial fibrillation (HCC)   Anemia, iron deficiency   Sepsis ([place])   Acute renal failure superimposed on stage 3 chronic kidney disease (HCC)   NSTEMI (non-ST elevated myocardial infarction) ([place])   Acute respiratory failure with hypoxia (HCC)   Lobar pneumonia (HCC)   Orbital mass   Fall   TIA (transient ischemic attack)   Depression  Protein-calorie malnutrition, moderate (HCC)   GIB (gastrointestinal bleeding)   Acute metabolic encephalopathy  Comfort measures only status: Patient has multiple chronic comorbidities, including TIA, hyperlipidemia, GERD, depression, atrial fibrillation not on anticoagulants, iron deficiency anemia, CKD-3, lower GI bleeding, dementia. Presents with sepsis due to lobar pneumonia. While pt is being treated with IV antibiotics and fluid resuscitation in ED, he developed rectal bleeding in ED and worsening mental status.  Found to have acute respiratory failure with hypoxia, acute metabolic encephalopathy, worsening renal function, incidental finding of retro-orbital mass with unclear etiology. His prognosis is extremely poor. We have had extensive discussion with his sons. Patient's family members were very supportive. They decided for comfort care now, which is reasonable given his progressive decline in ED. Pt will be DNR.  -Will admit to regular bed for comfort care. -Stop drawing labs and IVF -LORazepam 2 MG prn q4h SL for agitation -morphine 20 MG/ML concentrated solution (ROXANOL ): 10 mg prn SL q2h for pain or shortness of breath.  -Naphazoline 0.1 % ophthalmic solution:  Prn q6h for eye irritation -scopolamine 1.5 MG patch  (1.5 mg total) onto the skin every 3 (three)  -Psycho/Social: emotional support offered to patient and family at bedside -consult to palliterative care team in AM  Other medical problems as below, will start comfort care as above, no more IVF, Abx or lab draw.   Sepsis due to lobar pneumonia  NSTEMI (non-ST elevated myocardial infarction) ([place]): trop 0.8-->1.15. EKG showed new LBBB.   Acute metabolic encephalopathy  GERD  Hypercholesteremia  Atrial fibrillation (HCC)  Anemia, iron deficiency  Acute renal failure superimposed on stage 3 chronic kidney disease (HCC)  Acute respiratory failure with hypoxia (HCC)  Orbital mass  Fall  TIA (transient ischemic attack)  Depression  Protein-calorie malnutrition, moderate ([place])   Inpatient status:  # Patient requires inpatient status due to high intensity of service, high risk for further deterioration and high frequency of surveillance required.  I certify that at the point of admission it is my clinical judgment that the patient will require inpatient hospital care spanning beyond 2 midnights from the point of admission.  . This patient has multiple chronic comorbidities including TIA, hyperlipidemia, GERD, depression, atrial fibrillation not on anticoagulants, iron deficiency anemia, CKD-3, lower GI bleeding, dementia . Now patient has presenting multiple acute issues, including sepsis due to lobar pneumonia, acute respiratory failure with hypoxia, worsening renal function, non-STEMI, rectal bleeding, incidental finding of orbital mass . The worrisome physical exam findings include altered mental status, rectal bleeding . The initial radiographic and laboratory data are worrisome because of elevated troponin, leukocytosis, elevated lactic acid, worsening renal function, . Current medical needs: Family decided for comfort care. May need to stay in hospital for more than 2 days or transition to hospice care.    DVT ppx: SQ  Heparin Code Status: DNR (I discussed with patient in the presence of his son and daughter-in-law, and explained the meaning of CODE STATUS. Patient wants to be DNR) Family Communication:   Yes, patient's son and daughter-in-law at bed side Disposition Plan:  Anticipate discharge back to previous home environment Consults called:  none Admission status: Inpatient/tele     Date of Service 08/27/18    Ivor Costa [facility] Pager [phone]  If 7PM-7AM, please contact night-coverage [email or web address] Password [email or web address] 08/27/2018, 4:05 AM

## 2018-08-20 NOTE — ED Notes (Signed)
Pt aware we need urine specimen.  

## 2018-08-20 NOTE — ED Notes (Signed)
I Stat [place] Acid result of 3.55, reported to Dr. Carin Hock.

## 2018-08-20 NOTE — ED Notes (Signed)
O2 sensor placed on ear.  Waveform has not been consistent.  O2 noted to be low 80's w/ even pleth, pt placed on non rebreather w/ improvement in O2 sat to 92% w/ even pleth.

## 2018-08-20 NOTE — ED Triage Notes (Signed)
Per EMS: pt from home with c/o a mechanical fall as told by pt. Pt's son believes pt had a syncopal episode due to orthostatic changes he assessed.  Pt also has noticed dark stools x2 and a decline in appetite.  Pt recently was placed on a new med (Mirtazapine) for depression due to his wife recently passing.      EMS Vitals:  BP palpated 60 Repeat 115/67 after a 500 ML bolus HR 96-114 A-Fib CBG 92

## 2018-08-20 NOTE — ED Notes (Signed)
Dr Melina Copa informed of lactic acid results 2.98

## 2018-08-20 NOTE — ED Notes (Signed)
I Stat Trop I result of 0.81 reported to Dr. Carin Hock.

## 2018-08-21 DIAGNOSIS — G9341 Metabolic encephalopathy: Secondary | ICD-10-CM

## 2018-08-21 DIAGNOSIS — Z515 Encounter for palliative care: Secondary | ICD-10-CM

## 2018-08-21 DIAGNOSIS — K922 Gastrointestinal hemorrhage, unspecified: Secondary | ICD-10-CM | POA: Diagnosis present

## 2018-08-21 DIAGNOSIS — J9600 Acute respiratory failure, unspecified whether with hypoxia or hypercapnia: Secondary | ICD-10-CM

## 2018-08-21 DIAGNOSIS — R7989 Other specified abnormal findings of blood chemistry: Secondary | ICD-10-CM

## 2018-08-21 DIAGNOSIS — R6520 Severe sepsis without septic shock: Secondary | ICD-10-CM

## 2018-08-21 LAB — URINALYSIS, ROUTINE W REFLEX MICROSCOPIC
BILIRUBIN URINE: NEGATIVE
Glucose, UA: NEGATIVE mg/dL
Hgb urine dipstick: NEGATIVE
Ketones, ur: NEGATIVE mg/dL
Nitrite: NEGATIVE
Protein, ur: 30 mg/dL — AB
SPECIFIC GRAVITY, URINE: 1.018 (ref 1.005–1.030)
pH: 5 (ref 5.0–8.0)

## 2018-08-21 LAB — PROCALCITONIN

## 2018-08-21 LAB — LACTIC ACID, PLASMA
Lactic Acid, Venous: 3.1 mmol/L (ref 0.5–1.9)
Lactic Acid, Venous: 5.1 mmol/L (ref 0.5–1.9)

## 2018-08-21 LAB — TROPONIN I: TROPONIN I: 1.15 ng/mL — AB (ref <0.03)

## 2018-08-21 LAB — STREP PNEUMONIAE URINARY ANTIGEN: Strep Pneumo Urinary Antigen: NEGATIVE

## 2018-08-21 MED ORDER — MORPHINE SULFATE (PF) 2 MG/ML IV SOLN
2.0000 mg | INTRAVENOUS | Status: DC | PRN
  Administered 2018-08-21 (×2): 2 mg via INTRAVENOUS
  Filled 2018-08-21 (×2): qty 1

## 2018-08-21 MED ORDER — PANTOPRAZOLE SODIUM 40 MG IV SOLR: 40.0000 mg | Freq: Two times a day (BID) | INTRAVENOUS | Status: DC

## 2018-08-21 MED ORDER — SCOPOLAMINE 1 MG/3DAYS TD PT72
1.0000 | MEDICATED_PATCH | TRANSDERMAL | Status: DC
  Administered 2018-08-21: 1.5 mg via TRANSDERMAL
  Filled 2018-08-21: qty 1

## 2018-08-21 MED ORDER — HEPARIN BOLUS VIA INFUSION
3000.0000 [IU] | Freq: Once | INTRAVENOUS | Status: DC
  Filled 2018-08-21: qty 3000

## 2018-08-21 MED ORDER — HEPARIN (PORCINE) 25000 UT/250ML-% IV SOLN: 700.0000 [IU]/h | INTRAVENOUS | Status: DC

## 2018-08-21 MED ORDER — LORAZEPAM 2 MG/ML PO CONC
2.0000 mg | ORAL | Status: DC | PRN
  Administered 2018-08-21: 2 mg via SUBLINGUAL
  Filled 2018-08-21: qty 1

## 2018-08-21 MED ORDER — MORPHINE SULFATE (CONCENTRATE) 10 MG/0.5ML PO SOLN
10.0000 mg | ORAL | Status: DC | PRN
  Administered 2018-08-21: 10 mg via SUBLINGUAL
  Filled 2018-08-21: qty 0.5

## 2018-08-21 MED ORDER — NAPHAZOLINE-GLYCERIN 0.012-0.2 % OP SOLN: 1.0000 [drp] | Freq: Four times a day (QID) | OPHTHALMIC | Status: DC | PRN

## 2018-08-22 LAB — LEGIONELLA PNEUMOPHILA SEROGP 1 UR AG: L. PNEUMOPHILA SEROGP 1 UR AG: NEGATIVE

## 2018-08-24 ENCOUNTER — Telehealth: Payer: Self-pay | Admitting: Family Medicine

## 2018-08-24 NOTE — Progress Notes (Signed)
ANTICOAGULATION CONSULT NOTE - Initial Consult  Pharmacy Consult for heparin Indication: chest pain/ACS  Allergies  Allergen Reactions  . Tape Other (See Comments)    SKIN IS VERY THIN AND WILL TEAR AND BRUISE VERY EASILY!!  . Lisinopril Other (See Comments)    Insomnia   . Sulfadiazine Other (See Comments)    Unknown reaction    Patient Measurements: Height: 5\' 9"  (175.3 cm) Weight: 133 lb 2.5 oz (60.4 kg) IBW/kg (Calculated) : 70.7 Heparin Dosing Weight: 60.4 kg  Vital Signs: Temp: 97.4 F (36.3 C) (12/28 1740) Temp Source: Axillary (12/28 1740) BP: 114/59 (12/29 0030) Pulse Rate: 46 (12/29 0030)  Labs: Recent Labs    08/14/2018 1740 07/24/2018 2253  HGB 15.5 14.9  HCT 49.7 46.9  PLT 146* 144*  LABPROT 20.4*  --   INR 1.77  --   CREATININE 2.45*  --   TROPONINI  --  1.15*    Estimated Creatinine Clearance: 16.8 mL/min (A) (by C-G formula based on SCr of 2.45 mg/dL (H)).   Medical History: Past Medical History:  Diagnosis Date  . Atrial fibrillation [facility]) summer 2017   Eliquis initiated.   . B12 deficiency   . CKD (chronic kidney disease)    stage 3 in 03/2015  . Diverticulitis of colon with bleeding 03/11   Hospital 03/25-03/29/11, ARF due dehydr, acute blood loss anemia due divertic bleed  . History of CT scan of abdomen 11/29/09   No acute process, large R parapelvic renal cyst calcull  . History of ETT 11/24/09   myoview, nml EF 48%  . history of mini stroke 35   Dr. Earley Favor  . Lower GI bleed 04/11    acute blood loss anemia, transfusion 4 units PRBC's, non stemi    Medications:  See medication history  Assessment: 85 yo man to start heparin for CP.  He was not on AC PTA Hg 14.6, PTLC 144 Goal of Therapy:  Heparin level 0.3-0.7 units/ml Monitor platelets by anticoagulation protocol: Yes   Plan:  Heparin 3000 unit bolus and drip at 700 units/hr Check heparin level 6-8 hours after start Daily HL and CBC while on heparin Monitor for  bleeding complications  Ronnie Chan [place] 2018-08-28,12:54 AM

## 2018-08-24 NOTE — Consult Note (Signed)
Cardiology Consultation:   Patient ID: ROC STREETT MRN: [id number]; DOB: 09-Mar-1925  Admit date: 07/28/2018 Date of Consult: 09-04-2018  Primary Care Provider: Tonia Ghent, MD Primary Cardiologist:  Fletcher Anon    Patient Profile:   Ronnie Chan is a 85 y.o. male with a hx of dementia, AF, prior TIA, CKD, prior GIB, admitted with sepsis and pneumonia, who is being seen today for the evaluation of elevated troponin at the request of Dr Blaine Hamper.  History of Present Illness:   Ronnie Chan is a 85 y.o. male with a hx of dementia, AF, prior TIA, CKD, prior GIB, admitted with sepsis and pneumonia, who is being seen today for the evaluation of elevated troponin.  The patient has a history of AF for which he followed previously with Dr. Fletcher Anon. He was last seen in clinic in 05/2015, at which time he was doing well without any cardiac symptoms. He was seen in consultation by Dr. Harrington Challenger in 10/2015 for chest pain and elevated troponin in the setting of GIB. His symptoms were felt to be due to demand ischemia in the setting of his acute illness. His anticoagulation was held at that time and does not seem to have been restarted since.   He presented to the ED yesterday after a fall without associated LOC as well as worsening confusion and generalized weakness. In the ED, pt afebrile with HR recorded 30-106, SBP 100-120s. ECG showed AF with new LBBB-like IVCD. Labs included Cr 2.45 (baseline 1.6), Lactate 3.55. Troponin was initially 0.81 with subsequent measurement of 1.15. CXR showed bilateral opacities concerning for PNA vs edema. CT C-spine showed chronic ischemic cerebrovascular disease and R orbital mass.   He is admitted to the hospitalist service, and cardiology is consulted for recommendations regarding elevated troponin. On my evaluation, the patient is alert and oriented to person and time but not place. He endorses some upper abdominal pain but denies chest pain or dyspnea. History is  mostly gathered from his family at bedside who report that the patient has been declining since his wife died several months ago. Over the past weeks, his has had a more pronounced decline with worsening confusion and diminishing oral intake. He has not reported any cardiopulmonary symptoms to them.   Note: Following my evaluation of the patient, I was updated that the family will be pursuing comfort care for the patient     Past Medical History:  Diagnosis Date  . Atrial fibrillation [facility]) summer 2017   Eliquis initiated.   . B12 deficiency   . CKD (chronic kidney disease)    stage 3 in 03/2015  . Diverticulitis of colon with bleeding 03/11   Hospital 03/25-03/29/11, ARF due dehydr, acute blood loss anemia due divertic bleed  . History of CT scan of abdomen 11/29/09   No acute process, large R parapelvic renal cyst calcull  . History of ETT 11/24/09   myoview, nml EF 48%  . history of mini stroke 34   Dr. Earley Favor  . Lower GI bleed 04/11    acute blood loss anemia, transfusion 4 units PRBC's, non stemi    Past Surgical History:  Procedure Laterality Date  . CATARACT EXTRACTION, BILATERAL  09/1997- 10/1997  . COLONOSCOPY N/A 11/15/2015   Procedure: COLONOSCOPY;  Surgeon: Ladene Artist, MD;  Location: [facility] ENDOSCOPY;  Service: Endoscopy;  Laterality: N/A;  . ESOPHAGOGASTRODUODENOSCOPY N/A 11/15/2015   Procedure: ESOPHAGOGASTRODUODENOSCOPY (EGD);  Surgeon: Ladene Artist, MD;  Location: [facility] ENDOSCOPY;  Service: Endoscopy;  Laterality: N/A;  . SEPTOPLASTY  1990   Dr. Ernesto Rutherford, left  . TRACHEOSTOMY  7 yoa   Strep throat     Home Medications:  Prior to Admission medications   Medication Sig Start Date End Date Taking? Authorizing Provider  ferrous sulfate 325 (65 FE) MG EC tablet Take 325 mg by mouth daily with breakfast.   Yes [provider]  mirtazapine (REMERON) 7.5 MG tablet Take 1 tablet (7.5 mg total) by mouth at bedtime. 08/16/18  Yes Tonia Ghent, MD    omeprazole (PRILOSEC) 10 MG capsule Take 1 capsule by mouth daily. 05/12/18  Yes [provider]  sertraline (ZOLOFT) 50 MG tablet Take 1.5 tablets (75 mg total) by mouth daily. Patient taking differently: Take 50 mg by mouth daily.  08/11/18  Yes Tonia Ghent, MD  vitamin B-12 (CYANOCOBALAMIN) 1000 MCG tablet Take 1 tablet (1,000 mcg total) by mouth daily. 12/20/17  Yes Tonia Ghent, MD  tamsulosin (FLOMAX) 0.4 MG CAPS capsule TAKE 1 CAPSULE (0.4 MG TOTAL) BY MOUTH DAILY AFTER BREAKFAST. Patient taking differently: Take 0.4 mg by mouth daily after breakfast.  05/26/18   Tonia Ghent, MD    Inpatient Medications: Scheduled Meds: . scopolamine  1 patch Transdermal Q72H   Continuous Infusions:  PRN Meds: LORazepam, morphine CONCENTRATE, naphazoline-glycerin  Allergies:    Allergies  Allergen Reactions  . Tape Other (See Comments)    SKIN IS VERY THIN AND WILL TEAR AND BRUISE VERY EASILY!!  . Lisinopril Other (See Comments)    Insomnia   . Sulfadiazine Other (See Comments)    Unknown reaction    Social History:   Social History   Socioeconomic History  . Marital status: Married    Spouse name: Not on file  . Number of children: 2  . Years of education: Not on file  . Highest education level: Not on file  Occupational History  . Occupation: Retired Education officer, [employer]  Social Needs  . Financial resource strain: Not on file  . Food insecurity:    Worry: Not on file    Inability: Not on file  . Transportation needs:    Medical: Not on file    Non-medical: Not on file  Tobacco Use  . Smoking status: Never Smoker  . Smokeless tobacco: Never Used  Substance and Sexual Activity  . Alcohol use: No    Alcohol/week: 0.0 standard drinks  . Drug use: No  . Sexual activity: Not on file  Lifestyle  . Physical activity:    Days per week: Not on file    Minutes per session: Not on file  . Stress: Not on file  Relationships  . Social  connections:    Talks on phone: Not on file    Gets together: Not on file    Attends religious service: Not on file    Active member of club or organization: Not on file    Attends meetings of clubs or organizations: Not on file    Relationship status: Not on file  . Intimate partner violence:    Fear of current or ex partner: Not on file    Emotionally abused: Not on file    Physically abused: Not on file    Forced sexual activity: Not on file  Other Topics Concern  . Not on file  Social History Narrative   Widowed 05/2018.  Wife had prolonged decline from dementia and was housebound for years.     Family  History:     Family History  Problem Relation Age of Onset  . Heart disease Father        CHF  . Heart disease Paternal Grandfather        heart attacks     ROS:  ROS limited due to dementia but discussed in HPI.     Physical Exam/Data:   Vitals:   07/30/2018 2215 08/10/2018 2330 08/28/2018 0015 Aug 28, 2018 0030  BP: 104/75 (!) 111/94 111/71 (!) 114/59  Pulse:   (!) 34 (!) 46  Resp: (!) 22 (!) 22 (!) 27 17  Temp:      TempSrc:      SpO2:   94% 99%  Weight:      Height:        Intake/Output Summary (Last 24 hours) at 2018/08/28 0256 Last data filed at 08/28/2018 0223 Gross per 24 hour  Intake 3863.03 ml  Output -  Net 3863.03 ml   Filed Weights   08/09/2018 1734  Weight: 60.4 kg   Body mass index is 19.66 kg/m.  General:  Cachectic, chronically ill appearing  HEENT: Dry MM Neck: JVD at upper neck Cardiac:  Normal rate, irregularly irregular Lungs:  Increased WOB. Some coarse breath sounds bilaterally Abd: Mild tenderness with palpation of epigastrium  Ext: no edema Musculoskeletal:  No deformities, BUE and BLE strength normal and equal Skin: warm and dry  Neuro:  No focal abnormalities noted Psych:  Mildly confused. AO x2  EKG:  The EKG was personally reviewed and demonstrates:  AF with new LBBB-like IVCD Telemetry:  Telemetry was personally reviewed and  demonstrates:  Rate controlled AF  Relevant CV Studies:  Echo 04/2015: LV EF: 55% - 60% Study Conclusions - Left ventricle: The cavity size was normal. There was mild  concentric hypertrophy. Systolic function was normal. The  estimated ejection fraction was in the range of 55% to 60%. Wall  motion was normal; there were no regional wall motion  abnormalities. - Aortic valve: There was mild regurgitation. - Mitral valve: There was moderate regurgitation. - Left atrium: The atrium was moderately to severely dilated. - Right atrium: The atrium was mildly dilated. - Pulmonary arteries: Systolic pressure was mildly increased. PA  peak pressure: 46 mm Hg (S).  Pharmacologic nuclear stress test 2011: No evidence of ischemia  Laboratory Data:  Chemistry Recent Labs  Lab 07/28/2018 1740  NA 141  K 4.6  CL 109  CO2 17*  GLUCOSE 122*  BUN 66*  CREATININE 2.45*  CALCIUM 9.6  GFRNONAA 22*  GFRAA 26*  ANIONGAP 15    No results for input(s): PROT, ALBUMIN, AST, ALT, ALKPHOS, BILITOT in the last 168 hours. Hematology Recent Labs  Lab 08/05/2018 1740 07/31/2018 2253  WBC 11.2* 9.9  RBC 4.98 4.66  HGB 15.5 14.9  HCT 49.7 46.9  MCV 99.8 100.6*  MCH 31.1 32.0  MCHC 31.2 31.8  RDW 16.0* 15.8*  PLT 146* 144*   Cardiac Enzymes Recent Labs  Lab 07/24/2018 2253  TROPONINI 1.15*    Recent Labs  Lab 08/07/2018 1819  TROPIPOC 0.81*    BNPNo results for input(s): BNP, PROBNP in the last 168 hours.  DDimer No results for input(s): DDIMER in the last 168 hours.  Radiology/Studies:  Dg Chest 1 View  Result Date: 07/25/2018 CLINICAL DATA:  Shortness of breath. EXAM: CHEST  1 VIEW COMPARISON:  Radiographs of November 12, 2015. FINDINGS: Stable cardiomegaly. Atherosclerosis of thoracic aorta is noted. No pneumothorax is noted. New bilateral perihilar  and basilar opacities are noted concerning for edema or possibly pneumonia with small pleural effusions. Bony thorax is unremarkable.  IMPRESSION: New bilateral perihilar and basilar opacities are noted concerning for edema or pneumonia with small pleural effusions. Follow-up radiographs are recommended to ensure resolution and rule out underlying mass or neoplasm. Aortic Atherosclerosis (ICD10-I70.0). Electronically Signed   By: Marijo Conception, M.D.   On: 08/02/2018 18:53   Ct Head Wo Contrast  Result Date: 08/12/2018 CLINICAL DATA:  Fall.  Syncope.  Initial encounter. EXAM: CT HEAD WITHOUT CONTRAST CT CERVICAL SPINE WITHOUT CONTRAST TECHNIQUE: Multidetector CT imaging of the head and cervical spine was performed following the standard protocol without intravenous contrast. Multiplanar CT image reconstructions of the cervical spine were also generated. COMPARISON:  None. FINDINGS: CT HEAD FINDINGS Brain: There is no evidence of acute large territory infarct, intracranial hemorrhage, mass, midline shift, or extra-axial fluid collection. Small infarcts in the left greater than right basal ganglia/corona radiata and right cerebellum are favored to be chronic. There is mild-to-moderate cerebral atrophy. Patchy cerebral white matter hypodensities are nonspecific but compatible with mild-to-moderate chronic small-vessel ischemic disease. Vascular: Calcified atherosclerosis at the skull base. No hyperdense vessel. Skull: No fracture or suspicious osseous lesion. Sinuses/Orbits: Paranasal sinuses and mastoid air cells are clear. 15 x 8 mm intraconal mass in the posterior, superior right orbit. Bilateral cataract extraction. Other: None. CT CERVICAL SPINE FINDINGS Alignment: Trace anterolisthesis of C4 on C5 and C7 on T1, likely degenerative. Skull base and vertebrae: No acute fracture or destructive osseous lesion. 6 mm sclerotic focus in the left pars at T2, possibly a bone island. Soft tissues and spinal canal: No prevertebral fluid or swelling. No visible canal hematoma. Disc levels:  Mild for age cervical spondylosis and facet arthrosis. Upper  chest: Mild biapical lung scarring. Other: Moderate calcified plaque about the right greater than left carotid bifurcations. IMPRESSION: 1. No evidence of acute intracranial injury or acute cervical spine fracture. 2. Mild-to-moderate chronic small vessel ischemic disease with likely chronic lacunar infarcts in the basal ganglia and cerebellum. 3. 15 mm intraconal right orbital mass. Consider nonemergent MRI of the orbits without and with contrast for further evaluation. Electronically Signed   By: Logan Bores M.D.   On: 08/08/2018 21:03   Ct Cervical Spine Wo Contrast  Result Date: 08/12/2018 CLINICAL DATA:  Fall.  Syncope.  Initial encounter. EXAM: CT HEAD WITHOUT CONTRAST CT CERVICAL SPINE WITHOUT CONTRAST TECHNIQUE: Multidetector CT imaging of the head and cervical spine was performed following the standard protocol without intravenous contrast. Multiplanar CT image reconstructions of the cervical spine were also generated. COMPARISON:  None. FINDINGS: CT HEAD FINDINGS Brain: There is no evidence of acute large territory infarct, intracranial hemorrhage, mass, midline shift, or extra-axial fluid collection. Small infarcts in the left greater than right basal ganglia/corona radiata and right cerebellum are favored to be chronic. There is mild-to-moderate cerebral atrophy. Patchy cerebral white matter hypodensities are nonspecific but compatible with mild-to-moderate chronic small-vessel ischemic disease. Vascular: Calcified atherosclerosis at the skull base. No hyperdense vessel. Skull: No fracture or suspicious osseous lesion. Sinuses/Orbits: Paranasal sinuses and mastoid air cells are clear. 15 x 8 mm intraconal mass in the posterior, superior right orbit. Bilateral cataract extraction. Other: None. CT CERVICAL SPINE FINDINGS Alignment: Trace anterolisthesis of C4 on C5 and C7 on T1, likely degenerative. Skull base and vertebrae: No acute fracture or destructive osseous lesion. 6 mm sclerotic focus in the  left pars at T2, possibly a bone island. Soft  tissues and spinal canal: No prevertebral fluid or swelling. No visible canal hematoma. Disc levels:  Mild for age cervical spondylosis and facet arthrosis. Upper chest: Mild biapical lung scarring. Other: Moderate calcified plaque about the right greater than left carotid bifurcations. IMPRESSION: 1. No evidence of acute intracranial injury or acute cervical spine fracture. 2. Mild-to-moderate chronic small vessel ischemic disease with likely chronic lacunar infarcts in the basal ganglia and cerebellum. 3. 15 mm intraconal right orbital mass. Consider nonemergent MRI of the orbits without and with contrast for further evaluation. Electronically Signed   By: Logan Bores M.D.   On: 08/05/2018 21:03    Assessment and Plan:   Elevated troponin Lactic acidosis, suspected sepsis AKI Dementia, failure to thrive The patient has no known CAD and presents with lactic acidosis and suspected sepsis due to probable pneumonia. He has dementia with progressive decline and appears cachectic on exam. Cardiology was initially consulted for elevated troponin. ECG shows LBBB that is new from prior. The patient does not endorse chest pain, although history is limited by dementia and acute illness. His overall presentation is unlikely to be consistent with ACS, but rather his elevated troponin is more likely due to demand ischemia from his acute on chronic illness. Empiric heparinization was discussed, although this decision is not low risk due to his history of prior GIB and due the the findings of R orbital mass on CT head. Continued supportive care was recommended to the family on my evaluation with consideration of repeat ischemic evaluation once clinically stable. His family has now decided to pursue comfort care, which is reasonable given his progressive decline. Otherwise, the patient does show some signs of hypervolemia and may have new/progressive underlying HF; empiric  diuresis for comfort can be considered.     For questions or updates, please contact [place] Please consult [email or web address] for contact info under     Signed, Nila Nephew, MD  08-23-18 2:56 AM

## 2018-08-24 NOTE — ED Notes (Signed)
Dr. Blaine Hamper aware of critical lactic and troponin results.

## 2018-08-24 NOTE — Telephone Encounter (Signed)
Chart reviewed.  Called and talked to the patient's daughter-in-law, Lattie Haw.  I thanked her for taking the call.  Condolences offered.  We talked about recent events.  He was reportedly feeling well prior to a clear clinical decline that led to the emergency room evaluation.    I was always really glad to see this patient, along with his wife and the rest of his family.  They are really kind people and it should be noted that his extended family did a lot of work to care for both the patient and his previously deceased wife.  I appreciate the help of all involved in his care.  I thanked Lattie Haw for all of their effort.  She thanked me for the call.  I will miss seeing this kind and hard-working gentleman.

## 2018-08-24 NOTE — Discharge Summary (Signed)
Death Summary  Ronnie Chan:[id number] DOB: March 08, 1925 DOA: 2018-09-05  PCP: Tonia Ghent, MD  Admit date: 09-05-2018 Date of Death: 2018-09-06 Time of Death: 0820am Notification: Tonia Ghent, MD notified of death of 2018/09/06   History of present illness:  Ronnie Chan is a 85 y.o. male with a history of atrial fibrillation, chronic kidney disease stage III, history of lower GI bleeding, dementia, iron deficiency anemia, TIA, who who was admitted to the hospital after having a fall.  Patient son reported that the patient had been walking to the bathroom and suffered a fall.  He likely did not lose consciousness.  Patient had been having an overall functional decline.  He was having generalized weakness, dizziness, worsening confusion.  He did not have any unilateral weakness or numbness.  He did report shortness of breath on exertion.  He also had a cough productive of yellow-colored sputum.  He also had 2 large, dark loose bowel movements since the day prior to admission.    He was initially felt to have probable sepsis related to lobar pneumonia.  He also had acute renal failure superimposed on chronic kidney disease stage III.  He ruled in for non-ST elevation MI with positive troponin.  Initial plan was to admit him for aggressive treatments with intravenous antibiotics and IV fluids.  While in the emergency room, patient began to have further decline.  He became increasingly confused, had rising lactic acid.  He also developed more rectal bleeding.  CT scan of the head showed 50 mm intraconal right orbital mass.  With his multiple comorbidities and progressive decline, discussion was had with the family who elected to pursue comfort care.  Patient was admitted to the hospital for supportive treatment to improve his quality of life.  Shortly after admission, patient was found without heart sounds or respirations.  He was pronounced dead at that time.  Family was at the  bedside was provided support.   Final Diagnoses:  Principal Problem:   Comfort measures only status Active Problems:   GERD   Hypercholesteremia   Atrial fibrillation (HCC)   Anemia, iron deficiency   Sepsis ([place])   Acute renal failure superimposed on stage 3 chronic kidney disease (HCC)   NSTEMI (non-ST elevated myocardial infarction) ([place])   Acute respiratory failure with hypoxia (HCC)   Lobar pneumonia (HCC)   Orbital mass   Fall   TIA (transient ischemic attack)   Depression   Protein-calorie malnutrition, moderate (HCC)   GIB (gastrointestinal bleeding)   Acute metabolic encephalopathy     The results of significant diagnostics from this hospitalization (including imaging, microbiology, ancillary and laboratory) are listed below for reference.    Significant Diagnostic Studies: Dg Chest 1 View  Result Date: 2018-09-05 CLINICAL DATA:  Shortness of breath. EXAM: CHEST  1 VIEW COMPARISON:  Radiographs of November 12, 2015. FINDINGS: Stable cardiomegaly. Atherosclerosis of thoracic aorta is noted. No pneumothorax is noted. New bilateral perihilar and basilar opacities are noted concerning for edema or possibly pneumonia with small pleural effusions. Bony thorax is unremarkable. IMPRESSION: New bilateral perihilar and basilar opacities are noted concerning for edema or pneumonia with small pleural effusions. Follow-up radiographs are recommended to ensure resolution and rule out underlying mass or neoplasm. Aortic Atherosclerosis (ICD10-I70.0). Electronically Signed   By: Marijo Conception, M.D.   On: September 05, 2018 18:53   Ct Head Wo Contrast  Result Date: 2018/09/05 CLINICAL DATA:  Fall.  Syncope.  Initial encounter. EXAM: CT HEAD WITHOUT  CONTRAST CT CERVICAL SPINE WITHOUT CONTRAST TECHNIQUE: Multidetector CT imaging of the head and cervical spine was performed following the standard protocol without intravenous contrast. Multiplanar CT image reconstructions of the cervical spine were  also generated. COMPARISON:  None. FINDINGS: CT HEAD FINDINGS Brain: There is no evidence of acute large territory infarct, intracranial hemorrhage, mass, midline shift, or extra-axial fluid collection. Small infarcts in the left greater than right basal ganglia/corona radiata and right cerebellum are favored to be chronic. There is mild-to-moderate cerebral atrophy. Patchy cerebral white matter hypodensities are nonspecific but compatible with mild-to-moderate chronic small-vessel ischemic disease. Vascular: Calcified atherosclerosis at the skull base. No hyperdense vessel. Skull: No fracture or suspicious osseous lesion. Sinuses/Orbits: Paranasal sinuses and mastoid air cells are clear. 15 x 8 mm intraconal mass in the posterior, superior right orbit. Bilateral cataract extraction. Other: None. CT CERVICAL SPINE FINDINGS Alignment: Trace anterolisthesis of C4 on C5 and C7 on T1, likely degenerative. Skull base and vertebrae: No acute fracture or destructive osseous lesion. 6 mm sclerotic focus in the left pars at T2, possibly a bone island. Soft tissues and spinal canal: No prevertebral fluid or swelling. No visible canal hematoma. Disc levels:  Mild for age cervical spondylosis and facet arthrosis. Upper chest: Mild biapical lung scarring. Other: Moderate calcified plaque about the right greater than left carotid bifurcations. IMPRESSION: 1. No evidence of acute intracranial injury or acute cervical spine fracture. 2. Mild-to-moderate chronic small vessel ischemic disease with likely chronic lacunar infarcts in the basal ganglia and cerebellum. 3. 15 mm intraconal right orbital mass. Consider nonemergent MRI of the orbits without and with contrast for further evaluation. Electronically Signed   By: Logan Bores M.D.   On: 08/10/2018 21:03   Ct Cervical Spine Wo Contrast  Result Date: 08/19/2018 CLINICAL DATA:  Fall.  Syncope.  Initial encounter. EXAM: CT HEAD WITHOUT CONTRAST CT CERVICAL SPINE WITHOUT  CONTRAST TECHNIQUE: Multidetector CT imaging of the head and cervical spine was performed following the standard protocol without intravenous contrast. Multiplanar CT image reconstructions of the cervical spine were also generated. COMPARISON:  None. FINDINGS: CT HEAD FINDINGS Brain: There is no evidence of acute large territory infarct, intracranial hemorrhage, mass, midline shift, or extra-axial fluid collection. Small infarcts in the left greater than right basal ganglia/corona radiata and right cerebellum are favored to be chronic. There is mild-to-moderate cerebral atrophy. Patchy cerebral white matter hypodensities are nonspecific but compatible with mild-to-moderate chronic small-vessel ischemic disease. Vascular: Calcified atherosclerosis at the skull base. No hyperdense vessel. Skull: No fracture or suspicious osseous lesion. Sinuses/Orbits: Paranasal sinuses and mastoid air cells are clear. 15 x 8 mm intraconal mass in the posterior, superior right orbit. Bilateral cataract extraction. Other: None. CT CERVICAL SPINE FINDINGS Alignment: Trace anterolisthesis of C4 on C5 and C7 on T1, likely degenerative. Skull base and vertebrae: No acute fracture or destructive osseous lesion. 6 mm sclerotic focus in the left pars at T2, possibly a bone island. Soft tissues and spinal canal: No prevertebral fluid or swelling. No visible canal hematoma. Disc levels:  Mild for age cervical spondylosis and facet arthrosis. Upper chest: Mild biapical lung scarring. Other: Moderate calcified plaque about the right greater than left carotid bifurcations. IMPRESSION: 1. No evidence of acute intracranial injury or acute cervical spine fracture. 2. Mild-to-moderate chronic small vessel ischemic disease with likely chronic lacunar infarcts in the basal ganglia and cerebellum. 3. 15 mm intraconal right orbital mass. Consider nonemergent MRI of the orbits without and with contrast for further evaluation.  Electronically Signed   By:  Logan Bores M.D.   On: 08/06/2018 21:03    Microbiology: Recent Results (from the past 240 hour(s))  Blood Culture (routine x 2)     Status: None (Preliminary result)   Collection Time: 08/10/2018  7:02 PM  Result Value Ref Range Status   Specimen Description BLOOD LEFT ANTECUBITAL  Final   Special Requests   Final    BOTTLES DRAWN AEROBIC AND ANAEROBIC Blood Culture adequate volume   Culture   Final    NO GROWTH < 24 HOURS Performed at [facility], [address], [place], [place] [address]    Report Status PENDING  Incomplete  Blood Culture (routine x 2)     Status: None (Preliminary result)   Collection Time: 07/28/2018  7:30 PM  Result Value Ref Range Status   Specimen Description BLOOD RIGHT ANTECUBITAL  Final   Special Requests   Final    BOTTLES DRAWN AEROBIC AND ANAEROBIC Blood Culture adequate volume   Culture   Final    NO GROWTH < 24 HOURS Performed at [facility], [place] [address] [place], [place] [address]    Report Status PENDING  Incomplete     Labs: Basic Metabolic Panel: Recent Labs  Lab 07/27/2018 1740  NA 141  K 4.6  CL 109  CO2 17*  GLUCOSE 122*  BUN 66*  CREATININE 2.45*  CALCIUM 9.6   Liver Function Tests: No results for input(s): AST, ALT, ALKPHOS, BILITOT, PROT, ALBUMIN in the last 168 hours. No results for input(s): LIPASE, AMYLASE in the last 168 hours. No results for input(s): AMMONIA in the last 168 hours. CBC: Recent Labs  Lab 08/17/2018 1740 08/17/2018 2253  WBC 11.2* 9.9  NEUTROABS 9.7* 8.6*  HGB 15.5 14.9  HCT 49.7 46.9  MCV 99.8 100.6*  PLT 146* 144*   Cardiac Enzymes: Recent Labs  Lab 08/15/2018 2253  TROPONINI 1.15*   D-Dimer No results for input(s): DDIMER in the last 72 hours. BNP: Invalid input(s): POCBNP CBG: No results for input(s): GLUCAP in the last 168 hours. Anemia work up No results for input(s): VITAMINB12, FOLATE, FERRITIN, TIBC, IRON, RETICCTPCT in the last 72 hours. Urinalysis    Component  Value Date/Time   COLORURINE AMBER (A) 08/13/2018 2355   APPEARANCEUR HAZY (A) 08/23/2018 2355   LABSPEC 1.018 07/30/2018 2355   PHURINE 5.0 08/09/2018 2355   GLUCOSEU NEGATIVE 07/28/2018 2355   HGBUR NEGATIVE 07/26/2018 2355   BILIRUBINUR NEGATIVE 08/04/2018 2355   BILIRUBINUR Negative 08/03/2018 [place] 08/23/2018 2355   PROTEINUR 30 (A) 07/27/2018 2355   UROBILINOGEN 0.2 08/03/2018 1715   UROBILINOGEN 1.0 04/18/2014 1155   NITRITE NEGATIVE 07/25/2018 2355   LEUKOCYTESUR TRACE (A) 08/16/2018 2355   Sepsis Labs Invalid input(s): PROCALCITONIN,  WBC,  LACTICIDVEN     SIGNED:  Kathie Dike, MD  [facility] 09/20/18, 10:54 AM Pager   If 7PM-7AM, please contact night-coverage [email or web address] Password [email or web address]

## 2018-08-24 NOTE — ED Notes (Signed)
Attempted report 

## 2018-08-24 DEATH — deceased

## 2018-08-25 LAB — CULTURE, BLOOD (ROUTINE X 2)
Culture: NO GROWTH
Culture: NO GROWTH
SPECIAL REQUESTS: ADEQUATE
Special Requests: ADEQUATE
